# Patient Record
Sex: Female | Born: 1975 | Race: White | Hispanic: No | Marital: Married | Smoking: Former smoker
Health system: Southern US, Community
[De-identification: ages and names within clinical notes are randomized; demographics above are authoritative.]

## PROBLEM LIST (undated history)

## (undated) DIAGNOSIS — F32A Depression, unspecified: Secondary | ICD-10-CM

## (undated) DIAGNOSIS — E785 Hyperlipidemia, unspecified: Secondary | ICD-10-CM

## (undated) DIAGNOSIS — F419 Anxiety disorder, unspecified: Secondary | ICD-10-CM

## (undated) HISTORY — PX: WISDOM TOOTH EXTRACTION: SHX21

## (undated) HISTORY — PX: DILATION AND CURETTAGE OF UTERUS: SHX78

## (undated) HISTORY — DX: Anxiety disorder, unspecified: F41.9

## (undated) HISTORY — PX: HAMMER TOE SURGERY: SHX385

## (undated) HISTORY — DX: Hyperlipidemia, unspecified: E78.5

---

## 1998-07-20 ENCOUNTER — Other Ambulatory Visit: Admission: RE | Admit: 1998-07-20 | Discharge: 1998-07-20 | Payer: Self-pay | Admitting: Gynecology

## 2003-12-23 ENCOUNTER — Observation Stay: Payer: Self-pay | Admitting: Obstetrics and Gynecology

## 2009-10-15 ENCOUNTER — Ambulatory Visit: Payer: Self-pay | Admitting: Family Medicine

## 2012-04-24 ENCOUNTER — Ambulatory Visit: Payer: Self-pay | Admitting: Family Medicine

## 2016-02-24 ENCOUNTER — Encounter: Payer: Self-pay | Admitting: Family Medicine

## 2016-02-24 ENCOUNTER — Ambulatory Visit (INDEPENDENT_AMBULATORY_CARE_PROVIDER_SITE_OTHER): Payer: Self-pay | Admitting: Family Medicine

## 2016-02-24 ENCOUNTER — Other Ambulatory Visit: Payer: Self-pay

## 2016-02-24 VITALS — BP 98/64 | HR 78 | Temp 98.3°F | Resp 16 | Wt 139.6 lb

## 2016-02-24 DIAGNOSIS — Z0000 Encounter for general adult medical examination without abnormal findings: Secondary | ICD-10-CM

## 2016-02-24 DIAGNOSIS — J40 Bronchitis, not specified as acute or chronic: Secondary | ICD-10-CM

## 2016-02-24 DIAGNOSIS — B349 Viral infection, unspecified: Secondary | ICD-10-CM

## 2016-02-24 LAB — POCT INFLUENZA A/B
Influenza A, POC: NEGATIVE
Influenza B, POC: NEGATIVE

## 2016-02-24 MED ORDER — DOXYCYCLINE HYCLATE 100 MG PO TABS: 100.0000 mg | ORAL_TABLET | Freq: Two times a day (BID) | ORAL | 0 refills | Status: DC

## 2016-02-24 NOTE — Patient Instructions (Signed)
Acute Bronchitis, Adult Acute bronchitis is when air tubes (bronchi) in the lungs suddenly get swollen. The condition can make it hard to breathe. It can also cause these symptoms:  A cough.  Coughing up clear, yellow, or green mucus.  Wheezing.  Chest congestion.  Shortness of breath.  A fever.  Body aches.  Chills.  A sore throat. Follow these instructions at home: Medicines  Take over-the-counter and prescription medicines only as told by your doctor.  If you were prescribed an antibiotic medicine, take it as told by your doctor. Do not stop taking the antibiotic even if you start to feel better. General instructions  Rest.  Drink enough fluids to keep your pee (urine) clear or pale yellow.  Avoid smoking and secondhand smoke. If you smoke and you need help quitting, ask your doctor. Quitting will help your lungs heal faster.  Use an inhaler, cool mist vaporizer, or humidifier as told by your doctor.  Keep all follow-up visits as told by your doctor. This is important. How is this prevented? To lower your risk of getting this condition again:  Wash your hands often with soap and water. If you cannot use soap and water, use hand sanitizer.  Avoid contact with people who have cold symptoms.  Try not to touch your hands to your mouth, nose, or eyes.  Make sure to get the flu shot every year. Contact a doctor if:  Your symptoms do not get better in 2 weeks. Get help right away if:  You cough up blood.  You have chest pain.  You have very bad shortness of breath.  You become dehydrated.  You faint (pass out) or keep feeling like you are going to pass out.  You keep throwing up (vomiting).  You have a very bad headache.  Your fever or chills gets worse. This information is not intended to replace advice given to you by your health care provider. Make sure you discuss any questions you have with your health care provider. Document Released: 06/13/2007  Document Revised: 08/03/2015 Document Reviewed: 06/15/2015 Elsevier Interactive Patient Education  2017 [employer] Influenza, Adult Influenza, more commonly known as "the flu," is a viral infection that primarily affects the respiratory tract. The respiratory tract includes organs that help you breathe, such as the lungs, nose, and throat. The flu causes many common cold symptoms, as well as a high fever and body aches. The flu spreads easily from person to person (is contagious). Getting a flu shot (influenza vaccination) every year is the best way to prevent influenza. What are the causes? Influenza is caused by a virus. You can catch the virus by:  Breathing in droplets from an infected person's cough or sneeze.  Touching something that was recently contaminated with the virus and then touching your mouth, nose, or eyes. What increases the risk? The following factors may make you more likely to get the flu:  Not cleaning your hands frequently with soap and water or alcohol-based hand sanitizer.  Having close contact with many people during cold and flu season.  Touching your mouth, eyes, or nose without washing or sanitizing your hands first.  Not drinking enough fluids or not eating a healthy diet.  Not getting enough sleep or exercise.  Being under a high amount of stress.  Not getting a yearly (annual) flu shot. You may be at a higher risk of complications from the flu, such as a severe lung infection (pneumonia), if you:  Are over the age  of 65.  Are pregnant.  Have a weakened disease-fighting system (immune system). You may have a weakened immune system if you:  Have HIV or AIDS.  Are undergoing chemotherapy.  Aretaking medicines that reduce the activity of (suppress) the immune system.  Have a long-term (chronic) illness, such as heart disease, kidney disease, diabetes, or lung disease.  Have a liver disorder.  Are obese.  Have anemia. What are the signs  or symptoms? Symptoms of this condition typically last 4-10 days and may include:  Fever.  Chills.  Headache, body aches, or muscle aches.  Sore throat.  Cough.  Runny or congested nose.  Chest discomfort and cough.  Poor appetite.  Weakness or tiredness (fatigue).  Dizziness.  Nausea or vomiting. How is this diagnosed? This condition may be diagnosed based on your medical history and a physical exam. Your health care provider may do a nose or throat swab test to confirm the diagnosis. How is this treated? If influenza is detected early, you can be treated with antiviral medicine that can reduce the length of your illness and the severity of your symptoms. This medicine may be given by mouth (orally) or through an IV tube that is inserted in one of your veins. The goal of treatment is to relieve symptoms by taking care of yourself at home. This may include taking over-the-counter medicines, drinking plenty of fluids, and adding humidity to the air in your home. In some cases, influenza goes away on its own. Severe influenza or complications from influenza may be treated in a hospital. Follow these instructions at home:  Take over-the-counter and prescription medicines only as told by your health care provider.  Use a cool mist humidifier to add humidity to the air in your home. This can make breathing easier.  Rest as needed.  Drink enough fluid to keep your urine clear or pale yellow.  Cover your mouth and nose when you cough or sneeze.  Wash your hands with soap and water often, especially after you cough or sneeze. If soap and water are not available, use hand sanitizer.  Stay home from work or school as told by your health care provider. Unless you are visiting your health care provider, try to avoid leaving home until your fever has been gone for 24 hours without the use of medicine.  Keep all follow-up visits as told by your health care provider. This is  important. How is this prevented?  Getting an annual flu shot is the best way to avoid getting the flu. You may get the flu shot in late summer, fall, or winter. Ask your health care provider when you should get your flu shot.  Wash your hands often or use hand sanitizer often.  Avoid contact with people who are sick during cold and flu season.  Eat a healthy diet, drink plenty of fluids, get enough sleep, and exercise regularly. Contact a health care provider if:  You develop new symptoms.  You have:  Chest pain.  Diarrhea.  A fever.  Your cough gets worse.  You produce more mucus.  You feel nauseous or you vomit. Get help right away if:  You develop shortness of breath or difficulty breathing.  Your skin or nails turn a bluish color.  You have severe pain or stiffness in your neck.  You develop a sudden headache or sudden pain in your face or ear.  You cannot stop vomiting. This information is not intended to replace advice given to you by  your health care provider. Make sure you discuss any questions you have with your health care provider. Document Released: 12/23/1999 Document Revised: 06/02/2015 Document Reviewed: 10/19/2014 Elsevier Interactive Patient Education  2017 [employer].

## 2016-02-24 NOTE — Progress Notes (Signed)
Patient: Debbie Rowe Female    DOB: 1975-12-14   41 y.o.   MRN: [id number] Visit Date: 02/24/2016  Today's Provider: Dortha Kern, PA   Chief Complaint  Patient presents with  . URI   Subjective:    URI   This is a new problem. Episode onset: Sunday. The problem has been unchanged. Associated symptoms include coughing, diarrhea, headaches, neck pain, rhinorrhea and wheezing. Associated symptoms comments: Mother was diagnosed with Influenza B two days ago. Daughter beginning to have headache, scratchy throat, cough and body aches today, also.. Treatments tried: Tamiflu. The treatment provided no relief.   Past Medical History:  Diagnosis Date  . Anxiety    No past surgical history on file.  Family History  Problem Relation Age of Onset  . Depression Mother   . Cancer Maternal Grandmother   . Heart disease Maternal Grandfather   . Cancer Paternal Grandmother   . Heart disease Paternal Grandfather    No Known Allergies   Previous Medications   ALPRAZOLAM (XANAX) 0.5 MG TABLET    Take by mouth.   CITALOPRAM (CELEXA) 20 MG TABLET    Take by mouth.   COPPER PO    by Intrauterine route.    Review of Systems  Constitutional: Positive for chills and fatigue.  HENT: Positive for postnasal drip and rhinorrhea.   Respiratory: Positive for cough, chest tightness, shortness of breath and wheezing.   Cardiovascular: Negative.   Gastrointestinal: Positive for diarrhea.  Musculoskeletal: Positive for myalgias, neck pain and neck stiffness.  Neurological: Positive for light-headedness and headaches.    Social History  Substance Use Topics  . Smoking status: Former Games developer  . Smokeless tobacco: Never Used  . Alcohol use No   Objective:   BP 98/64 (BP Location: Right Arm, Patient Position: Sitting, Cuff Size: Normal)   Pulse 78   Temp 98.3 F (36.8 C) (Oral)   Resp 16   Wt 139 lb 9.6 oz (63.3 kg)   SpO2 98%   Physical Exam  Constitutional: She is oriented to person,  place, and time. She appears well-developed and well-nourished. No distress.  HENT:  Head: Normocephalic and atraumatic.  Right Ear: Hearing and external ear normal.  Left Ear: Hearing and external ear normal.  Nose: Nose normal.  Slight irritation to tonsillar pillars. No exudates.  Eyes: Conjunctivae and lids are normal. Right eye exhibits no discharge. Left eye exhibits no discharge. No scleral icterus.  Neck: Neck supple.  Cardiovascular: Normal rate and regular rhythm.   Pulmonary/Chest: Effort normal. No respiratory distress. She has no wheezes. She has no rales.  Coarse breath sounds.  Abdominal: Soft. Bowel sounds are normal.  Musculoskeletal: Normal range of motion.  Neurological: She is alert and oriented to person, place, and time.  Skin: Skin is intact. No lesion and no rash noted.  Psychiatric: She has a normal mood and affect. Her speech is normal and behavior is normal. Thought content normal.      Assessment & Plan:     1. Bronchitis Onset with chills, low grade fever, headache and hacking cough with occasional sputum production (some blood in phlegm this morning). No wheeze or rales but some coarse breath sounds. Continue Mucinex-DM and Tylenol prn. Increase fluids and start antibiotic. Recheck prn. - CBC with Differential/Platelet - doxycycline (VIBRA-TABS) 100 MG tablet; Take 1 tablet (100 mg total) by mouth 2 (two) times daily.  Dispense: 20 tablet; Refill: 0 - POCT Influenza A/B  2. Viral illness Mother diagnosed  with influenza-B 2 days ago. She was given prophylactic Tamiflu but flu test negative today. Will check CBC and continue Tamiflu. - CBC with Differential/Platelet

## 2016-02-25 LAB — CBC WITH DIFFERENTIAL/PLATELET
BASOS: 1 %
Basophils Absolute: 0 10*3/uL (ref 0.0–0.2)
EOS (ABSOLUTE): 0 10*3/uL (ref 0.0–0.4)
EOS: 1 %
HEMATOCRIT: 40.3 % (ref 34.0–46.6)
Hemoglobin: 13.6 g/dL (ref 11.1–15.9)
IMMATURE GRANULOCYTES: 0 %
Immature Grans (Abs): 0 10*3/uL (ref 0.0–0.1)
LYMPHS ABS: 1.5 10*3/uL (ref 0.7–3.1)
Lymphs: 33 %
MCH: 30.2 pg (ref 26.6–33.0)
MCHC: 33.7 g/dL (ref 31.5–35.7)
MCV: 90 fL (ref 79–97)
MONOS ABS: 0.5 10*3/uL (ref 0.1–0.9)
Monocytes: 12 %
NEUTROS ABS: 2.4 10*3/uL (ref 1.4–7.0)
Neutrophils: 53 %
PLATELETS: 244 10*3/uL (ref 150–379)
RBC: 4.5 x10E6/uL (ref 3.77–5.28)
RDW: 12.9 % (ref 12.3–15.4)
WBC: 4.5 10*3/uL (ref 3.4–10.8)

## 2016-04-20 ENCOUNTER — Encounter: Payer: Self-pay | Admitting: Emergency Medicine

## 2016-04-20 DIAGNOSIS — Z79899 Other long term (current) drug therapy: Secondary | ICD-10-CM | POA: Diagnosis not present

## 2016-04-20 DIAGNOSIS — M549 Dorsalgia, unspecified: Secondary | ICD-10-CM | POA: Insufficient documentation

## 2016-04-20 DIAGNOSIS — R1030 Lower abdominal pain, unspecified: Secondary | ICD-10-CM | POA: Diagnosis not present

## 2016-04-20 DIAGNOSIS — R1010 Upper abdominal pain, unspecified: Secondary | ICD-10-CM | POA: Insufficient documentation

## 2016-04-20 DIAGNOSIS — Z87891 Personal history of nicotine dependence: Secondary | ICD-10-CM | POA: Insufficient documentation

## 2016-04-20 DIAGNOSIS — G8929 Other chronic pain: Secondary | ICD-10-CM | POA: Diagnosis not present

## 2016-04-20 LAB — COMPREHENSIVE METABOLIC PANEL
ALK PHOS: 64 U/L (ref 38–126)
ALT: 15 U/L (ref 14–54)
AST: 17 U/L (ref 15–41)
Albumin: 4 g/dL (ref 3.5–5.0)
Anion gap: 7 (ref 5–15)
BUN: 19 mg/dL (ref 6–20)
CALCIUM: 9 mg/dL (ref 8.9–10.3)
CO2: 25 mmol/L (ref 22–32)
CREATININE: 0.55 mg/dL (ref 0.44–1.00)
Chloride: 104 mmol/L (ref 101–111)
Glucose, Bld: 95 mg/dL (ref 65–99)
Potassium: 3.7 mmol/L (ref 3.5–5.1)
Sodium: 136 mmol/L (ref 135–145)
Total Bilirubin: 0.3 mg/dL (ref 0.3–1.2)
Total Protein: 6.9 g/dL (ref 6.5–8.1)

## 2016-04-20 LAB — CBC
HCT: 36.2 % (ref 35.0–47.0)
Hemoglobin: 12.8 g/dL (ref 12.0–16.0)
MCH: 31.7 pg (ref 26.0–34.0)
MCHC: 35.2 g/dL (ref 32.0–36.0)
MCV: 89.9 fL (ref 80.0–100.0)
PLATELETS: 285 10*3/uL (ref 150–440)
RBC: 4.03 MIL/uL (ref 3.80–5.20)
RDW: 12.7 % (ref 11.5–14.5)
WBC: 10.5 10*3/uL (ref 3.6–11.0)

## 2016-04-20 LAB — LIPASE, BLOOD: Lipase: 33 U/L (ref 11–51)

## 2016-04-20 NOTE — ED Triage Notes (Signed)
Pt ambulatory to triage in NAD, reports lower back pain x several weeks, already seeing chiropractor.  Pt reports abd swelling and pressure over past few days, states discomfort awakens her from sleep.

## 2016-04-21 ENCOUNTER — Emergency Department: Payer: Managed Care, Other (non HMO)

## 2016-04-21 ENCOUNTER — Emergency Department
Admission: EM | Admit: 2016-04-21 | Discharge: 2016-04-21 | Disposition: A | Discharge status: Home / Self Care | Payer: Managed Care, Other (non HMO) | Attending: Emergency Medicine | Admitting: Emergency Medicine

## 2016-04-21 DIAGNOSIS — M549 Dorsalgia, unspecified: Secondary | ICD-10-CM

## 2016-04-21 DIAGNOSIS — G8929 Other chronic pain: Secondary | ICD-10-CM

## 2016-04-21 DIAGNOSIS — R1030 Lower abdominal pain, unspecified: Secondary | ICD-10-CM

## 2016-04-21 LAB — URINALYSIS, COMPLETE (UACMP) WITH MICROSCOPIC
BACTERIA UA: NONE SEEN
BILIRUBIN URINE: NEGATIVE
Glucose, UA: NEGATIVE mg/dL
Hgb urine dipstick: NEGATIVE
KETONES UR: NEGATIVE mg/dL
Leukocytes, UA: NEGATIVE
Nitrite: NEGATIVE
PROTEIN: NEGATIVE mg/dL
SPECIFIC GRAVITY, URINE: 1.027 (ref 1.005–1.030)
pH: 5 (ref 5.0–8.0)

## 2016-04-21 LAB — PREGNANCY, URINE: Preg Test, Ur: NEGATIVE

## 2016-04-21 LAB — WET PREP, GENITAL
CLUE CELLS WET PREP: NONE SEEN
Sperm: NONE SEEN
TRICH WET PREP: NONE SEEN
Yeast Wet Prep HPF POC: NONE SEEN

## 2016-04-21 LAB — CHLAMYDIA/NGC RT PCR (ARMC ONLY)
CHLAMYDIA TR: NOT DETECTED
N gonorrhoeae: NOT DETECTED

## 2016-04-21 MED ORDER — IOPAMIDOL (ISOVUE-300) INJECTION 61%
100.0000 mL | Freq: Once | INTRAVENOUS | Status: AC | PRN
  Administered 2016-04-21: 100 mL via INTRAVENOUS

## 2016-04-21 MED ORDER — IOPAMIDOL (ISOVUE-300) INJECTION 61%
30.0000 mL | Freq: Once | INTRAVENOUS | Status: AC
  Administered 2016-04-21: 30 mL via ORAL

## 2016-04-21 MED ORDER — ONDANSETRON HCL 4 MG/2ML IJ SOLN
4.0000 mg | Freq: Once | INTRAMUSCULAR | Status: AC
  Administered 2016-04-21: 4 mg via INTRAVENOUS
  Filled 2016-04-21: qty 2

## 2016-04-21 MED ORDER — TRAMADOL HCL 50 MG PO TABS: 50.0000 mg | ORAL_TABLET | Freq: Four times a day (QID) | ORAL | 0 refills | Status: DC | PRN

## 2016-04-21 MED ORDER — KETOROLAC TROMETHAMINE 30 MG/ML IJ SOLN
30.0000 mg | Freq: Once | INTRAMUSCULAR | Status: AC
  Administered 2016-04-21: 30 mg via INTRAVENOUS
  Filled 2016-04-21: qty 1

## 2016-04-21 MED ORDER — ONDANSETRON 4 MG PO TBDP: 4.0000 mg | ORAL_TABLET | Freq: Three times a day (TID) | ORAL | 0 refills | Status: DC | PRN

## 2016-04-21 NOTE — ED Provider Notes (Signed)
[facility] Emergency Department Provider Note   ____________________________________________   First MD Initiated Contact with Patient 04/21/16 [phone]     (approximate)  I have reviewed the triage vital signs and the nursing notes.   HISTORY  Chief Complaint Back Pain and Abdominal Pain    HPI Debbie Rowe is a 41 y.o. female who comes into the hospital today with back pain and abdominal pain. The patient reports a 6-7 weeks ago he started having some severe back pain. She had a similar episode 3 years ago where she saw the chiropractor and the pain improved and she stopped seeing them. The patient started back seen the chiropractor and has been doing therapy but reports that she developed some swelling in her abdomen 3 days ago. She also reports that she has some abdominal discomfort so bad that she is unable to sleep. The patient reports that she has been taking ibuprofen Midol and heating pads but hasn't helped. The patient reports that her menstrual cycle is 2 weeks late. She's had decreased energy. She also has an IUD. She came in tonight because she wanted to rule out anything. She reports that initially she would like an MRI to look at her back and then wants to make sure everything is okay and her belly. The patient denies any diarrhea and says that she has normal bowel movements. The patient is here today for evaluation.   Past Medical History:  Diagnosis Date  . Anxiety   . Hyperlipidemia     There are no active problems to display for this patient.   Past Surgical History:  Procedure Laterality Date  . CESAREAN SECTION      Prior to Admission medications   Medication Sig Start Date End Date Taking? Authorizing Provider  ALPRAZolam Prudy Feeler) 0.5 MG tablet Take by mouth.    Historical Provider, MD  citalopram (CELEXA) 20 MG tablet Take by mouth.    Historical Provider, MD  COPPER PO by Intrauterine route.    Historical Provider, MD    doxycycline (VIBRA-TABS) 100 MG tablet Take 1 tablet (100 mg total) by mouth 2 (two) times daily. 02/24/16   Jodell Cipro Chrismon, PA  ondansetron (ZOFRAN ODT) 4 MG disintegrating tablet Take 1 tablet (4 mg total) by mouth every 8 (eight) hours as needed for nausea or vomiting. 04/21/16   Rebecka Apley, MD  traMADol (ULTRAM) 50 MG tablet Take 1 tablet (50 mg total) by mouth every 6 (six) hours as needed. 04/21/16   Rebecka Apley, MD    Allergies Patient has no known allergies.  Family History  Problem Relation Age of Onset  . Depression Mother   . Cancer Maternal Grandmother   . Heart disease Maternal Grandfather   . Cancer Paternal Grandmother   . Heart disease Paternal Grandfather     Social History Social History  Substance Use Topics  . Smoking status: Former Games developer  . Smokeless tobacco: Never Used  . Alcohol use No    Review of Systems Constitutional: No fever/chills Eyes: No visual changes. ENT: No sore throat. Cardiovascular: Denies chest pain. Respiratory: Denies shortness of breath. Gastrointestinal:  abdominal pain.  No nausea, no vomiting.  No diarrhea.  No constipation. Genitourinary: Negative for dysuria. Musculoskeletal:back pain. Skin: Negative for rash. Neurological: Negative for headaches, focal weakness or numbness.  10-point ROS otherwise negative.  ____________________________________________   PHYSICAL EXAM:  VITAL SIGNS: ED Triage Vitals  Enc Vitals Group     BP 04/20/16 2202  131/71     Pulse Rate 04/20/16 2202 76     Resp 04/20/16 2202 16     Temp 04/20/16 2202 98.3 F (36.8 C)     Temp Source 04/20/16 2202 Oral     SpO2 04/20/16 2202 99 %     Weight 04/20/16 2203 136 lb (61.7 kg)     Height 04/20/16 2203 5' (1.524 m)     Head Circumference --      Peak Flow --      Pain Score 04/20/16 2202 7     Pain Loc --      Pain Edu? --      Excl. in GC? --     Constitutional: Alert and oriented. Well appearing and in mild  distress. Eyes: Conjunctivae are normal. PERRL. EOMI. Head: Atraumatic. Nose: No congestion/rhinnorhea. Mouth/Throat: Mucous membranes are moist.  Oropharynx non-erythematous. Cardiovascular: Normal rate, regular rhythm. Grossly normal heart sounds.  Good peripheral circulation. Respiratory: Normal respiratory effort.  No retractions. Lungs CTAB. Gastrointestinal: Soft and nontender. No distention. Positive bowel sounds Genitourinary: normal external genitalia, the patient's IUD strings are visualized. She has no cervical motion tenderness but her uterus does feel mildly enlarged. The patient has no significant pain at this time. Musculoskeletal: No lower extremity tenderness nor edema.   Neurologic:  Normal speech and language.  Skin:  Skin is warm, dry and intact. Psychiatric: Mood and affect are normal.   ____________________________________________   LABS (all labs ordered are listed, but only abnormal results are displayed)  Labs Reviewed  WET PREP, GENITAL - Abnormal; Notable for the following:       Result Value   WBC, Wet Prep HPF POC MANY (*)    All other components within normal limits  URINALYSIS, COMPLETE (UACMP) WITH MICROSCOPIC - Abnormal; Notable for the following:    Color, Urine YELLOW (*)    APPearance HAZY (*)    Squamous Epithelial / LPF 0-5 (*)    All other components within normal limits  CHLAMYDIA/NGC RT PCR ([facility] ONLY)  LIPASE, BLOOD  COMPREHENSIVE METABOLIC PANEL  CBC  PREGNANCY, URINE   ____________________________________________  EKG  none ____________________________________________  RADIOLOGY  CT abd and pelvis ____________________________________________   PROCEDURES  Procedure(s) performed: None  Procedures  Critical Care performed: No  ____________________________________________   INITIAL IMPRESSION / ASSESSMENT AND PLAN / ED COURSE  Pertinent labs & imaging results that were available during my care of the  patient were reviewed by me and considered in my medical decision making (see chart for details).  This is a 41 year old who comes into the hospital today with some abdominal discomfort as well as some back pain. The patient came in to get checked out. I informed her that without having neurologic deficit and with this being 6 weeks of pain I could not do an MRI but I did perform a CT scan given the patient's uterine fullness. I fear is that the patient is concerned about having cancer. CT came back showing a small ovarian cyst on the right. Otherwise the patient has no further complaints or concerns. She will be discharged to home. I informed her that she should follow-up with her primary care physician. The patient be discharged home. The patient did receive some Toradol for her discomfort.   CT abd and pelvis: No acute abnormality of the abdomen or pelvis.      ____________________________________________   FINAL CLINICAL IMPRESSION(S) / ED DIAGNOSES  Final diagnoses:  Lower abdominal pain  Chronic midline back  pain, unspecified back location      NEW MEDICATIONS STARTED DURING THIS VISIT:  Discharge Medication List as of 04/21/2016  4:48 AM    START taking these medications   Details  ondansetron (ZOFRAN ODT) 4 MG disintegrating tablet Take 1 tablet (4 mg total) by mouth every 8 (eight) hours as needed for nausea or vomiting., Starting Sat 04/21/2016, Print    traMADol (ULTRAM) 50 MG tablet Take 1 tablet (50 mg total) by mouth every 6 (six) hours as needed., Starting Sat 04/21/2016, Print         Note:  This document was prepared using Dragon voice recognition software and may include unintentional dictation errors.    Rebecka Apley, MD 04/21/16 [phone]

## 2016-04-21 NOTE — ED Notes (Signed)
MD Webster at bedside at this time.  

## 2016-04-21 NOTE — ED Notes (Signed)
This RN to call bell, pt reports nausea and burning sensation, requesting meds and or "Tums or Rolaids", primary RN and Dr Zenda Alpers notified, orders rx'd

## 2016-04-21 NOTE — ED Notes (Signed)
Pt states current tx for back pain.   Pt also states acute swelling and discomfort throughout abdomen, specifically lower. Pt states current 90-yr IUD (41 year old), 2 weeks late on period.

## 2016-04-21 NOTE — ED Notes (Signed)
Pelvic cart and EDP at bedside att, CT called for IV and pt finished contrast

## 2016-12-13 ENCOUNTER — Other Ambulatory Visit: Payer: Self-pay | Admitting: Obstetrics and Gynecology

## 2016-12-13 DIAGNOSIS — Z1231 Encounter for screening mammogram for malignant neoplasm of breast: Secondary | ICD-10-CM

## 2018-01-29 ENCOUNTER — Ambulatory Visit (INDEPENDENT_AMBULATORY_CARE_PROVIDER_SITE_OTHER): Payer: 59

## 2018-01-29 ENCOUNTER — Encounter: Payer: Self-pay | Admitting: Orthopaedic Surgery

## 2018-01-29 ENCOUNTER — Ambulatory Visit (INDEPENDENT_AMBULATORY_CARE_PROVIDER_SITE_OTHER): Payer: 59 | Admitting: Orthopaedic Surgery

## 2018-01-29 VITALS — BP 123/72 | HR 69 | Ht 59.0 in

## 2018-01-29 DIAGNOSIS — M25562 Pain in left knee: Secondary | ICD-10-CM

## 2018-01-29 DIAGNOSIS — G8929 Other chronic pain: Secondary | ICD-10-CM

## 2018-01-29 DIAGNOSIS — M25561 Pain in right knee: Secondary | ICD-10-CM

## 2018-01-29 NOTE — Progress Notes (Signed)
Subjective:    Patient ID: Debbie Rowe, female    DOB: Sep 18, 1975, 43 y.o.   MRN: [id number]  HPI She has bilateral knee pain, more on the right than the left. She has had pain over the last year with the right knee.  She had swelling and popping.  She was seen at [facility] and had aspiration and prednisone injection in late June.  She did well until December. 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  She had swelling and pain again and limited motion.  She went back to [place] [facility] and had another aspiration and prednisone injection.  She is referred here for further evaluation.  She has no giving way, no redness, no direct trauma.  She stopped wearing high heels a year ago.  She takes an occasional Advil as needed.     Review of Systems  Constitutional: Positive for activity change.  Musculoskeletal: Positive for arthralgias, gait problem and joint swelling.  Psychiatric/Behavioral: The patient is nervous/anxious.   All other systems reviewed and are negative.  For Review of Systems, all other systems reviewed and are negative.  The following is a summary of the past history medically, past history surgically, known current medicines, social history and family history.  This information is gathered electronically by the computer from prior information and documentation.  I review this each visit and have found including this information at this point in the chart is beneficial and informative.   Past Medical History:  Diagnosis Date  . Anxiety   . Hyperlipidemia     Past Surgical History:  Procedure Laterality Date  . CESAREAN SECTION      Current Outpatient Medications on File Prior to Visit  Medication Sig Dispense Refill  . ALPRAZolam (XANAX) 0.5 MG tablet Take by mouth.    . citalopram (CELEXA) 20 MG tablet Take by mouth.    . COPPER PO by Intrauterine route.    . doxycycline (VIBRA-TABS) 100 MG tablet Take 1 tablet (100 mg total)  by mouth 2 (two) times daily. 20 tablet 0  . ondansetron (ZOFRAN ODT) 4 MG disintegrating tablet Take 1 tablet (4 mg total) by mouth every 8 (eight) hours as needed for nausea or vomiting. 20 tablet 0  . traMADol (ULTRAM) 50 MG tablet Take 1 tablet (50 mg total) by mouth every 6 (six) hours as needed. 12 tablet 0   No current facility-administered medications on file prior to visit.     Social History   Socioeconomic History  . Marital status: Married    Spouse name: Not on file  . Number of children: Not on file  . Years of education: Not on file  . Highest education level: Not on file  Occupational History  . Not on file  Social Needs  . Financial resource strain: Not on file  . Food insecurity:    Worry: Not on file    Inability: Not on file  . Transportation needs:    Medical: Not on file    Non-medical: Not on file  Tobacco Use  . Smoking status: Former Games developer  . Smokeless tobacco: Never Used  Substance and Sexual Activity  . Alcohol use: No  . Drug use: No  . Sexual activity: Not on file  Lifestyle  . Physical activity:    Days per week: Not on file    Minutes per session: Not on file  . Stress: Not on file  Relationships  . Social connections:    Talks on phone: Not on file    Gets together: Not on file    Attends religious service: Not on file    Active member of club or organization: Not on file    Attends meetings of clubs or organizations: Not on file    Relationship status: Not on file  . Intimate partner violence:    Fear of current or ex partner: Not on file    Emotionally abused: Not on file    Physically abused: Not on file    Forced sexual activity: Not on file  Other Topics Concern  . Not on file  Social History Narrative  . Not on file    Family History  Problem Relation Age of Onset  . Depression Mother   . Cancer Maternal Grandmother   . Heart disease Maternal Grandfather   . Cancer Paternal Grandmother   . Heart disease Paternal  Grandfather     BP 123/72   Pulse 69   Ht 4\' 11"  (1.499 m)   BMI 27.47 kg/m   Body mass index is 27.47 kg/m.      Objective:   Physical Exam Constitutional:      Appearance: She is well-developed.  HENT:     Head: Normocephalic and atraumatic.  Eyes:     Conjunctiva/sclera: Conjunctivae normal.     Pupils: Pupils are equal, round, and reactive to light.  Neck:     Musculoskeletal: Normal range of motion and neck supple.  Cardiovascular:     Rate and Rhythm: Normal rate and regular rhythm.  Pulmonary:     Effort: Pulmonary effort is normal.  Abdominal:     Palpations: Abdomen is soft.  Musculoskeletal:     Right knee: She exhibits decreased range of motion. Tenderness found. Medial joint line tenderness noted.     Left knee: Tenderness found. Medial joint line tenderness noted.       Legs:  Skin:    General: Skin is warm and dry.  Neurological:     Mental Status: She is alert and oriented to person, place, and time.     Cranial Nerves: No cranial nerve deficit.     Motor: No abnormal muscle tone.     Coordination: Coordination normal.     Deep Tendon Reflexes: Reflexes are normal and symmetric. Reflexes normal.  Psychiatric:        Behavior: Behavior normal.        Thought Content: Thought content normal.        Judgment: Judgment normal.      X-rays were done of both knees, reported separately.     Assessment & Plan:   Encounter Diagnosis  Name Primary?  . Bilateral chronic knee pain Yes   I am concerned about a medial meniscus tear of the right knee.  I would like to get a MRI.  I have recommended Advil or Aleve as needed.  Return after the MRI.  Call if any problem.  Precautions discussed.   Electronically Signed Darreld Mclean, MD 1/22/20203:29 PM

## 2018-12-09 ENCOUNTER — Other Ambulatory Visit: Payer: Self-pay

## 2018-12-09 DIAGNOSIS — Z20822 Contact with and (suspected) exposure to covid-19: Secondary | ICD-10-CM

## 2018-12-11 ENCOUNTER — Telehealth: Payer: Self-pay | Admitting: *Deleted

## 2018-12-11 LAB — NOVEL CORONAVIRUS, NAA: SARS-CoV-2, NAA: NOT DETECTED

## 2018-12-11 NOTE — Telephone Encounter (Signed)
Patient calling for test results- notified negative for COVID

## 2019-06-18 ENCOUNTER — Ambulatory Visit: Payer: 59 | Attending: Internal Medicine

## 2019-06-18 ENCOUNTER — Other Ambulatory Visit: Payer: Self-pay

## 2019-06-18 DIAGNOSIS — Z20822 Contact with and (suspected) exposure to covid-19: Secondary | ICD-10-CM | POA: Insufficient documentation

## 2019-06-19 LAB — NOVEL CORONAVIRUS, NAA: SARS-CoV-2, NAA: NOT DETECTED

## 2019-06-19 LAB — SARS-COV-2, NAA 2 DAY TAT

## 2020-01-18 ENCOUNTER — Encounter: Payer: Self-pay | Admitting: Emergency Medicine

## 2020-01-18 ENCOUNTER — Ambulatory Visit
Admission: EM | Admit: 2020-01-18 | Discharge: 2020-01-18 | Disposition: A | Discharge status: Home / Self Care | Payer: Commercial Managed Care - PPO | Attending: Emergency Medicine | Admitting: Emergency Medicine

## 2020-01-18 ENCOUNTER — Other Ambulatory Visit: Payer: Self-pay

## 2020-01-18 DIAGNOSIS — R509 Fever, unspecified: Secondary | ICD-10-CM | POA: Insufficient documentation

## 2020-01-18 DIAGNOSIS — J020 Streptococcal pharyngitis: Secondary | ICD-10-CM | POA: Diagnosis not present

## 2020-01-18 DIAGNOSIS — R52 Pain, unspecified: Secondary | ICD-10-CM

## 2020-01-18 DIAGNOSIS — Z1152 Encounter for screening for COVID-19: Secondary | ICD-10-CM | POA: Insufficient documentation

## 2020-01-18 DIAGNOSIS — R519 Headache, unspecified: Secondary | ICD-10-CM | POA: Diagnosis present

## 2020-01-18 DIAGNOSIS — J029 Acute pharyngitis, unspecified: Secondary | ICD-10-CM | POA: Insufficient documentation

## 2020-01-18 LAB — POCT RAPID STREP A (OFFICE): Rapid Strep A Screen: NEGATIVE

## 2020-01-18 MED ORDER — LIDOCAINE VISCOUS HCL 2 % MT SOLN: 15.0000 mL | OROMUCOSAL | 1 refills | Status: DC | PRN

## 2020-01-18 NOTE — ED Provider Notes (Signed)
[facility]   [id number] 01/18/20 Arrival Time: 0943  CC: COVID symptoms   SUBJECTIVE: History from: patient.  MAURINA FAWAZ is a 45 y.o. female who who presented to the urgent care for complaint of chills, fever, sore throat, body aches, headache for the past 2 to 3 days.  Denies sick exposure or precipitating event.  Has tried OTC medication without relief.-Aggravating factors.  R Previous symptoms in the past.    Denies fever, chills, decreased appetite, decreased activity, drooling, vomiting, wheezing, rash, changes in bowel or bladder function.     ROS: As per HPI.  All other pertinent ROS negative.      Past Medical History:  Diagnosis Date  . Anxiety   . Hyperlipidemia    Past Surgical History:  Procedure Laterality Date  . CESAREAN SECTION     No Known Allergies No current facility-administered medications on file prior to encounter.   Current Outpatient Medications on File Prior to Encounter  Medication Sig Dispense Refill  . ALPRAZolam (XANAX) 0.5 MG tablet Take by mouth.    . citalopram (CELEXA) 20 MG tablet Take by mouth.    . COPPER PO by Intrauterine route.    . doxycycline (VIBRA-TABS) 100 MG tablet Take 1 tablet (100 mg total) by mouth 2 (two) times daily. 20 tablet 0  . ondansetron (ZOFRAN ODT) 4 MG disintegrating tablet Take 1 tablet (4 mg total) by mouth every 8 (eight) hours as needed for nausea or vomiting. 20 tablet 0  . traMADol (ULTRAM) 50 MG tablet Take 1 tablet (50 mg total) by mouth every 6 (six) hours as needed. 12 tablet 0   Social History   Socioeconomic History  . Marital status: Married    Spouse name: Not on file  . Number of children: Not on file  . Years of education: Not on file  . Highest education level: Not on file  Occupational History  . Not on file  Tobacco Use  . Smoking status: Former Games developer  . Smokeless tobacco: Never Used  Substance and Sexual Activity  . Alcohol use: No  . Drug use: No  . Sexual activity:  Not on file  Other Topics Concern  . Not on file  Social History Narrative  . Not on file   Social Determinants of Health   Financial Resource Strain: Not on file  Food Insecurity: Not on file  Transportation Needs: Not on file  Physical Activity: Not on file  Stress: Not on file  Social Connections: Not on file  Intimate Partner Violence: Not on file   Family History  Problem Relation Age of Onset  . Depression Mother   . Cancer Maternal Grandmother   . Heart disease Maternal Grandfather   . Cancer Paternal Grandmother   . Heart disease Paternal Grandfather     OBJECTIVE:  Vitals:   01/18/20 0959 01/18/20 1000  BP: 133/81   Pulse: 75   Resp: 18   Temp: (!) 97.5 F (36.4 C)   TempSrc: Oral   SpO2: 95%   Weight:  140 lb (63.5 kg)  Height:  4\' 11"  (1.499 m)     General appearance: alert; smiling and laughing during encounter; nontoxic appearance HEENT: NCAT; Ears: EACs clear, TMs pearly gray; Eyes: PERRL.  EOM grossly intact. Nose: no rhinorrhea without nasal flaring; Throat: oropharynx clear, tolerating own secretions, tonsils not erythematous or enlarged, uvula midline Neck: supple without LAD; FROM Lungs: CTA bilaterally without adventitious breath sounds; normal respiratory effort, no belly breathing  or accessory muscle use; no cough present Heart: regular rate and rhythm.  Radial pulses 2+ symmetrical bilaterally Abdomen: soft; normal active bowel sounds; nontender to palpation Skin: warm and dry; no obvious rashes Psychological: alert and cooperative; normal mood and affect appropriate for age   ASSESSMENT & PLAN:  1. Sore throat   2. Encounter for screening for COVID-19   3. Fever and chills   4. Body aches   5. Acute nonintractable headache, unspecified headache type     Meds ordered this encounter  Medications  . lidocaine (XYLOCAINE) 2 % solution    Sig: Use as directed 15 mLs in the mouth or throat as needed for mouth pain.    Dispense:  100 mL     Refill:  1     Discharge instructions  Strep test is negative.  Sample will be sent for culture and someone will call if your result is abnormal  COVID-19, flu A/B testing ordered.  It may take between 2- 7 days for test results.  Someone will call if your result is abnormal  Lidocaine mouthwash was prescribed for sore throat./Take as directed Gargle with salty warm water May use OTC lozenges such as Halls, Vicks or Cepacol to soothe throat Encourage fluid intake.  Continue to alternate Tylenol/ibuprofen as needed for pain and fever Follow up with PCP Call or go to the ED if child has any new or worsening symptoms like fever, decreased appetite, decreased activity, turning blue, nasal flaring, rib retractions, wheezing, rash, changes in bowel or bladder habits, etc...   Reviewed expectations re: course of current medical issues. Questions answered. Outlined signs and symptoms indicating need for more acute intervention. Patient verbalized understanding. After Visit Summary given.          Durward Parcel, FNP 01/18/20 1112

## 2020-01-18 NOTE — Discharge Instructions (Signed)
Strep test is negative.  Sample will be sent for culture and someone will call if your result is abnormal  COVID-19, flu A/B testing ordered.  It may take between 2- 7 days for test results.  Someone will call if your result is abnormal  Lidocaine mouthwash was prescribed for sore throat./Take as directed Gargle with salty warm water May use OTC lozenges such as Halls, Vicks or Cepacol to soothe throat Encourage fluid intake.  Continue to alternate Tylenol/ibuprofen as needed for pain and fever Follow up with PCP Call or go to the ED if child has any new or worsening symptoms like fever, decreased appetite, decreased activity, turning blue, nasal flaring, rib retractions, wheezing, rash, changes in bowel or bladder habits, etc..Marland Kitchen

## 2020-01-18 NOTE — ED Triage Notes (Signed)
Body aches, chills, sore throat, headache since saturday

## 2020-01-19 LAB — CULTURE, GROUP A STREP (THRC)

## 2020-01-20 LAB — CULTURE, GROUP A STREP (THRC)

## 2020-01-21 LAB — COVID-19, FLU A+B NAA
Influenza A, NAA: NOT DETECTED
Influenza B, NAA: NOT DETECTED
SARS-CoV-2, NAA: NOT DETECTED

## 2020-10-11 ENCOUNTER — Other Ambulatory Visit (HOSPITAL_COMMUNITY): Payer: Self-pay | Admitting: Physician Assistant

## 2020-10-11 ENCOUNTER — Other Ambulatory Visit: Payer: Self-pay | Admitting: Physician Assistant

## 2020-10-11 DIAGNOSIS — M2392 Unspecified internal derangement of left knee: Secondary | ICD-10-CM

## 2020-10-24 ENCOUNTER — Other Ambulatory Visit: Payer: Self-pay

## 2020-10-24 ENCOUNTER — Ambulatory Visit (HOSPITAL_COMMUNITY): Admission: RE | Admit: 2020-10-24 | Payer: Commercial Managed Care - PPO | Source: Ambulatory Visit

## 2020-10-24 ENCOUNTER — Encounter (HOSPITAL_COMMUNITY): Payer: Self-pay

## 2021-03-09 ENCOUNTER — Other Ambulatory Visit: Payer: Self-pay | Admitting: Obstetrics and Gynecology

## 2021-03-09 DIAGNOSIS — Z1231 Encounter for screening mammogram for malignant neoplasm of breast: Secondary | ICD-10-CM

## 2021-03-24 ENCOUNTER — Other Ambulatory Visit: Payer: Self-pay | Admitting: Obstetrics and Gynecology

## 2021-03-27 NOTE — H&P (Signed)
Debbie Rowe is a 46 y.o. female here for IUD removal ?. ?Pt here for IUD retrieval . U/s show IUD in fundal position ?  ?  ?Past Medical History:  has a past medical history of Anxiety and Depression.  ?Past Surgical History:  has a past surgical history that includes Dilation and curettage of uterus and Cesarean section. ?Family History: family history includes Breast cancer in her maternal aunt, paternal aunt, and paternal grandmother. ?Social History:  reports that she has quit smoking. She has never used smokeless tobacco. She reports that she does not drink alcohol. ?OB/GYN History:  ?        ?OB History   ?  Gravida  ?2  ? Para  ?1  ? Term  ?1  ? Preterm  ?   ? AB  ?1  ? Living  ?1  ?  ?  SAB  ?1  ? IAB  ?   ? Ectopic  ?   ? Molar  ?   ? Multiple  ?   ? Live Births  ?1  ?  ?  ?   ?  ?  ?Allergies: has No Known Allergies. ?Medications: ?  ?Current Outpatient Medications:  ?  ALPRAZolam (XANAX) 0.5 MG tablet, Take 0.5 mg by mouth 3 (three) times daily as needed for Sleep., Disp: , Rfl:  ?  citalopram (CELEXA) 20 MG tablet, Take 5 mg by mouth once daily, Disp: , Rfl:  ?  copper intrauterine contraceptive (PARAGARD T) IUD, Insert 1 each into the uterus once Placed 06/2014  , Disp: , Rfl:  ?  doxylamine succinate (UNISOM) 25 mg tablet, Take 25 mg by mouth nightly as needed for Sleep, Disp: , Rfl:  ?  tranexamic acid (LYSTEDA) 650 mg tablet, Take 2 tablets (1,300 mg total) by mouth 3 (three) times daily Take for a maximum of 5 days during monthly menstruation. (Patient not taking: Reported on 03/09/2021), Disp: 30 tablet, Rfl: 3 ?  ?Review of Systems: ?General:                      No fatigue or weight loss ?Eyes:                           No vision changes ?Ears:                            No hearing difficulty ?Respiratory:                No cough or shortness of breath ?Pulmonary:                  No asthma or shortness of breath ?Cardiovascular:           No chest pain, palpitations, dyspnea on  exertion ?Gastrointestinal:          No abdominal bloating, chronic diarrhea, constipations, masses, pain or hematochezia ?Genitourinary:             No hematuria, dysuria, abnormal vaginal discharge, pelvic pain, Menometrorrhagia ?Lymphatic:                   No swollen lymph nodes ?Musculoskeletal:         No muscle weakness ?Neurologic:                  No extremity weakness, syncope, seizure disorder ?Psychiatric:  No history of depression, delusions or suicidal/homicidal ideation ?  ?  ? Exam:  ?  ?There were no vitals filed for this visit. ?  ?Body mass index is 29.89 kg/m?. ?  ?WDWN white/  female in NAD   ?Lungs: CTA  ?CV : RRR without murmur   ?  ?Pelvic: tanner stage 5 ,  ?External genitalia: vulva /labia no lesions ?Urethra: no prolapse ?Vagina: normal physiologic d/c ?Cervix: stenotic no lesions, no cervical motion tenderness  , cervix dilated with multiple dilators  And several attempts made with IUD retriever , randall stone forceps under ultrasound guidance . Pt tolerated poorly and I aborted the efforts . Throughout the procedure I asked  For verbal approval to proceed .  ?Uterus: normal size shape and contour, non-tender ?Adnexa: no mass,  non-tender   ?Rectovaginal:  ?Impression:  ?  ?The encounter diagnosis was Intrauterine contraceptive device threads lost, initial encounter. ?  ?  ?  ?Plan:  ?  ?Attempted to retrieve the IUD unsuccessful secondary to stenotic cervix and intolerance by the pt . I will schedule in the main OR for removal  ?  ?  ?  ?  ?No follow-ups on file. ?  ?Vilma Prader, MD ?  ?  ?  ?   ?  ?Electronically signed by Vilma Prader, MD on 03/23/2021  4:50 PM  ? ?

## 2021-03-29 ENCOUNTER — Encounter
Admission: RE | Admit: 2021-03-29 | Discharge: 2021-03-29 | Disposition: A | Discharge status: Home / Self Care | Payer: Commercial Managed Care - PPO | Source: Ambulatory Visit | Attending: Obstetrics and Gynecology | Admitting: Obstetrics and Gynecology

## 2021-03-29 ENCOUNTER — Other Ambulatory Visit: Payer: Self-pay

## 2021-03-29 NOTE — Patient Instructions (Addendum)
Your procedure is scheduled on: Friday, March 24 ?Report to the Registration Desk on the 1st floor of the [facility]. ?To find out your arrival time, please call [phone] between 1PM - 3PM on: Thursday, March 23 ? ?REMEMBER: ?Instructions that are not followed completely may result in serious medical risk, up to and including death; or upon the discretion of your surgeon and anesthesiologist your surgery may need to be rescheduled. ? ?Do not eat or drink after midnight the night before surgery.  ?No gum chewing, lozengers or hard candies. ? ?DO NOT TAKE ANY MEDICATIONS THE MORNING OF SURGERY ? ?One week prior to surgery: ?Stop Anti-inflammatories (NSAIDS) such as Advil, Aleve, Ibuprofen, Motrin, Naproxen, Naprosyn and Aspirin based products such as Excedrin, Goodys Powder, BC Powder. ?Stop ANY OVER THE COUNTER supplements until after surgery. ?You may however, continue to take Tylenol if needed for pain up until the day of surgery. ? ?No Alcohol for 24 hours before or after surgery. ? ?No Smoking including e-cigarettes for 24 hours prior to surgery.  ?No chewable tobacco products for at least 6 hours prior to surgery.  ?No nicotine patches on the day of surgery. ? ?Do not use any "recreational" drugs for at least a week prior to your surgery.  ?Please be advised that the combination of cocaine and anesthesia may have negative outcomes, up to and including death. ?If you test positive for cocaine, your surgery will be cancelled. ? ?On the morning of surgery brush your teeth with toothpaste and water, you may rinse your mouth with mouthwash if you wish. ?Do not swallow any toothpaste or mouthwash. ? ?Do not wear jewelry, make-up, hairpins, clips or nail polish. ? ?Do not wear lotions, powders, or perfumes.  ? ?Do not shave body from the neck down 48 hours prior to surgery just in case you cut yourself which could leave a site for infection.  ? ?Contact lenses, hearing aids and dentures may not be worn into  surgery. ? ?Do not bring valuables to the hospital. [facility] is not responsible for any missing/lost belongings or valuables.  ? ?Notify your doctor if there is any change in your medical condition (cold, fever, infection). ? ?Wear comfortable clothing (specific to your surgery type) to the hospital. ? ?After surgery, you can help prevent lung complications by doing breathing exercises.  ?Take deep breaths and cough every 1-2 hours. Your doctor may order a device called an Incentive Spirometer to help you take deep breaths. ? ?If you are being discharged the day of surgery, you will not be allowed to drive home. ?You will need a responsible adult (18 years or older) to drive you home and stay with you that night.  ? ?If you are taking public transportation, you will need to have a responsible adult (18 years or older) with you. ?Please confirm with your physician that it is acceptable to use public transportation.  ? ?Please call the Pre-admissions Testing Dept. at [phone] if you have any questions about these instructions. ? ?Surgery Visitation Policy: ? ?Patients undergoing a surgery or procedure may have two family members or support persons with them as long as the person is not COVID-19 positive or experiencing its symptoms.  ?

## 2021-03-31 ENCOUNTER — Ambulatory Visit: Payer: Commercial Managed Care - PPO | Admitting: Certified Registered"

## 2021-03-31 ENCOUNTER — Other Ambulatory Visit: Payer: Self-pay

## 2021-03-31 ENCOUNTER — Encounter
Admission: RE | Disposition: A | Discharge status: Home / Self Care | Payer: Self-pay | Source: Home / Self Care | Attending: Obstetrics and Gynecology

## 2021-03-31 ENCOUNTER — Encounter: Payer: Self-pay | Admitting: Obstetrics and Gynecology

## 2021-03-31 ENCOUNTER — Ambulatory Visit
Admission: RE | Admit: 2021-03-31 | Discharge: 2021-03-31 | Disposition: A | Discharge status: Home / Self Care | Payer: Commercial Managed Care - PPO | Attending: Obstetrics and Gynecology | Admitting: Obstetrics and Gynecology

## 2021-03-31 DIAGNOSIS — Z803 Family history of malignant neoplasm of breast: Secondary | ICD-10-CM | POA: Diagnosis not present

## 2021-03-31 DIAGNOSIS — Z01818 Encounter for other preprocedural examination: Secondary | ICD-10-CM

## 2021-03-31 DIAGNOSIS — M4802 Spinal stenosis, cervical region: Secondary | ICD-10-CM | POA: Insufficient documentation

## 2021-03-31 DIAGNOSIS — F32A Depression, unspecified: Secondary | ICD-10-CM | POA: Insufficient documentation

## 2021-03-31 DIAGNOSIS — Z87891 Personal history of nicotine dependence: Secondary | ICD-10-CM | POA: Insufficient documentation

## 2021-03-31 DIAGNOSIS — F419 Anxiety disorder, unspecified: Secondary | ICD-10-CM | POA: Diagnosis not present

## 2021-03-31 DIAGNOSIS — Z30432 Encounter for removal of intrauterine contraceptive device: Secondary | ICD-10-CM | POA: Insufficient documentation

## 2021-03-31 HISTORY — PX: IUD REMOVAL: SHX5392

## 2021-03-31 LAB — CBC
HCT: 37.7 % (ref 36.0–46.0)
Hemoglobin: 12.6 g/dL (ref 12.0–15.0)
MCH: 30.3 pg (ref 26.0–34.0)
MCHC: 33.4 g/dL (ref 30.0–36.0)
MCV: 90.6 fL (ref 80.0–100.0)
Platelets: 331 10*3/uL (ref 150–400)
RBC: 4.16 MIL/uL (ref 3.87–5.11)
RDW: 12.4 % (ref 11.5–15.5)
WBC: 4.7 10*3/uL (ref 4.0–10.5)
nRBC: 0 % (ref 0.0–0.2)

## 2021-03-31 LAB — TYPE AND SCREEN
ABO/RH(D): O NEG
Antibody Screen: NEGATIVE

## 2021-03-31 LAB — BASIC METABOLIC PANEL
Anion gap: 6 (ref 5–15)
BUN: 16 mg/dL (ref 6–20)
CO2: 27 mmol/L (ref 22–32)
Calcium: 8.4 mg/dL — ABNORMAL LOW (ref 8.9–10.3)
Chloride: 105 mmol/L (ref 98–111)
Creatinine, Ser: 0.55 mg/dL (ref 0.44–1.00)
GFR, Estimated: >60 mL/min (ref >60)
Glucose, Bld: 100 mg/dL — ABNORMAL HIGH (ref 70–99)
Potassium: 3.9 mmol/L (ref 3.5–5.1)
Sodium: 138 mmol/L (ref 135–145)

## 2021-03-31 LAB — ABO/RH: ABO/RH(D): O NEG

## 2021-03-31 LAB — POCT PREGNANCY, URINE: Preg Test, Ur: NEGATIVE

## 2021-03-31 SURGERY — REMOVAL, INTRAUTERINE DEVICE
Anesthesia: General

## 2021-03-31 MED ORDER — MIDAZOLAM HCL 2 MG/2ML IJ SOLN
INTRAMUSCULAR | Status: AC
  Filled 2021-03-31: qty 2

## 2021-03-31 MED ORDER — FAMOTIDINE 20 MG PO TABS
ORAL_TABLET | ORAL | Status: AC
  Administered 2021-03-31: 20 mg via ORAL
  Filled 2021-03-31: qty 1

## 2021-03-31 MED ORDER — DEXAMETHASONE SODIUM PHOSPHATE 10 MG/ML IJ SOLN
INTRAMUSCULAR | Status: DC | PRN
  Administered 2021-03-31: 10 mg via INTRAVENOUS

## 2021-03-31 MED ORDER — ONDANSETRON HCL 4 MG/2ML IJ SOLN
INTRAMUSCULAR | Status: DC | PRN
  Administered 2021-03-31: 4 mg via INTRAVENOUS

## 2021-03-31 MED ORDER — PROPOFOL 500 MG/50ML IV EMUL
INTRAVENOUS | Status: AC
  Filled 2021-03-31: qty 50

## 2021-03-31 MED ORDER — ACETAMINOPHEN 500 MG PO TABS
ORAL_TABLET | ORAL | Status: AC
  Administered 2021-03-31: 1000 mg via ORAL
  Filled 2021-03-31: qty 2

## 2021-03-31 MED ORDER — LACTATED RINGERS IV SOLN: INTRAVENOUS | Status: DC

## 2021-03-31 MED ORDER — CEFAZOLIN SODIUM-DEXTROSE 2-4 GM/100ML-% IV SOLN
2.0000 g | Freq: Once | INTRAVENOUS | Status: AC
  Administered 2021-03-31: 2 g via INTRAVENOUS

## 2021-03-31 MED ORDER — KETOROLAC TROMETHAMINE 30 MG/ML IJ SOLN
INTRAMUSCULAR | Status: DC | PRN
  Administered 2021-03-31: 30 mg via INTRAVENOUS

## 2021-03-31 MED ORDER — FENTANYL CITRATE (PF) 100 MCG/2ML IJ SOLN
INTRAMUSCULAR | Status: DC | PRN
  Administered 2021-03-31: 50 ug via INTRAVENOUS

## 2021-03-31 MED ORDER — FENTANYL CITRATE (PF) 100 MCG/2ML IJ SOLN: 25.0000 ug | INTRAMUSCULAR | Status: DC | PRN

## 2021-03-31 MED ORDER — MIDAZOLAM HCL 2 MG/2ML IJ SOLN
INTRAMUSCULAR | Status: DC | PRN
  Administered 2021-03-31: 2 mg via INTRAVENOUS

## 2021-03-31 MED ORDER — LACTATED RINGERS IV SOLN: INTRAVENOUS | Status: DC | PRN

## 2021-03-31 MED ORDER — GABAPENTIN 300 MG PO CAPS
ORAL_CAPSULE | ORAL | Status: AC
  Administered 2021-03-31: 300 mg via ORAL
  Filled 2021-03-31: qty 1

## 2021-03-31 MED ORDER — FERRIC SUBSULFATE 259 MG/GM EX SOLN
CUTANEOUS | Status: AC
  Filled 2021-03-31: qty 8

## 2021-03-31 MED ORDER — FENTANYL CITRATE (PF) 100 MCG/2ML IJ SOLN
INTRAMUSCULAR | Status: AC
  Filled 2021-03-31: qty 2

## 2021-03-31 MED ORDER — PROPOFOL 10 MG/ML IV BOLUS
INTRAVENOUS | Status: DC | PRN
  Administered 2021-03-31: 250 mg via INTRAVENOUS

## 2021-03-31 MED ORDER — FAMOTIDINE 20 MG PO TABS: 20.0000 mg | ORAL_TABLET | Freq: Once | ORAL | Status: AC

## 2021-03-31 MED ORDER — CHLORHEXIDINE GLUCONATE 0.12 % MT SOLN: 15.0000 mL | Freq: Once | OROMUCOSAL | Status: AC

## 2021-03-31 MED ORDER — ORAL CARE MOUTH RINSE: 15.0000 mL | Freq: Once | OROMUCOSAL | Status: AC

## 2021-03-31 MED ORDER — SILVER NITRATE-POT NITRATE 75-25 % EX MISC
CUTANEOUS | Status: DC | PRN
  Administered 2021-03-31: 3

## 2021-03-31 MED ORDER — LIDOCAINE HCL (CARDIAC) PF 100 MG/5ML IV SOSY
PREFILLED_SYRINGE | INTRAVENOUS | Status: DC | PRN
  Administered 2021-03-31: 100 mg via INTRAVENOUS

## 2021-03-31 MED ORDER — ACETAMINOPHEN 500 MG PO TABS: 1000.0000 mg | ORAL_TABLET | ORAL | Status: AC

## 2021-03-31 MED ORDER — OXYCODONE HCL 5 MG PO TABS: 5.0000 mg | ORAL_TABLET | Freq: Once | ORAL | Status: DC | PRN

## 2021-03-31 MED ORDER — GABAPENTIN 300 MG PO CAPS: 300.0000 mg | ORAL_CAPSULE | ORAL | Status: AC

## 2021-03-31 MED ORDER — OXYCODONE HCL 5 MG/5ML PO SOLN: 5.0000 mg | Freq: Once | ORAL | Status: DC | PRN

## 2021-03-31 MED ORDER — POVIDONE-IODINE 10 % EX SWAB
2.0000 "application " | Freq: Once | CUTANEOUS | Status: AC
  Administered 2021-03-31: 2 via TOPICAL

## 2021-03-31 MED ORDER — CEFAZOLIN SODIUM-DEXTROSE 2-4 GM/100ML-% IV SOLN
INTRAVENOUS | Status: AC
  Filled 2021-03-31: qty 100

## 2021-03-31 MED ORDER — CHLORHEXIDINE GLUCONATE 0.12 % MT SOLN
OROMUCOSAL | Status: AC
  Administered 2021-03-31: 15 mL via OROMUCOSAL
  Filled 2021-03-31: qty 15

## 2021-03-31 MED ORDER — ONDANSETRON HCL 4 MG/2ML IJ SOLN: 4.0000 mg | Freq: Once | INTRAMUSCULAR | Status: DC | PRN

## 2021-03-31 SURGICAL SUPPLY — 27 items
APPLICATOR SWAB PROCTO LG 16IN (MISCELLANEOUS) ×2 IMPLANT
BACTOSHIELD CHG 4% 4OZ (MISCELLANEOUS) ×1
DRSG TELFA 3X8 NADH (GAUZE/BANDAGES/DRESSINGS) IMPLANT
ELECT REM PT RETURN 9FT ADLT (ELECTROSURGICAL) ×2
ELECTRODE REM PT RTRN 9FT ADLT (ELECTROSURGICAL) ×1 IMPLANT
GLOVE SURG SYN 8.0 (GLOVE) ×2 IMPLANT
GLOVE SURG SYN 8.0 PF PI (GLOVE) ×1 IMPLANT
GOWN STRL REUS W/ TWL LRG LVL3 (GOWN DISPOSABLE) ×1 IMPLANT
GOWN STRL REUS W/ TWL XL LVL3 (GOWN DISPOSABLE) ×1 IMPLANT
GOWN STRL REUS W/TWL LRG LVL3 (GOWN DISPOSABLE) ×2
GOWN STRL REUS W/TWL XL LVL3 (GOWN DISPOSABLE) ×2
IV LACTATED RINGER IRRG 3000ML (IV SOLUTION) ×2
IV LR IRRIG 3000ML ARTHROMATIC (IV SOLUTION) ×1 IMPLANT
KIT PROCEDURE FLUENT (KITS) IMPLANT
KIT TURNOVER CYSTO (KITS) ×2 IMPLANT
MANIFOLD NEPTUNE II (INSTRUMENTS) ×2 IMPLANT
PACK DNC HYST (MISCELLANEOUS) ×2 IMPLANT
PAD DRESSING TELFA 3X8 NADH (GAUZE/BANDAGES/DRESSINGS) IMPLANT
PAD OB MATERNITY 4.3X12.25 (PERSONAL CARE ITEMS) ×2 IMPLANT
PAD PREP 24X41 OB/GYN DISP (PERSONAL CARE ITEMS) ×2 IMPLANT
SCRUB CHG 4% DYNA-HEX 4OZ (MISCELLANEOUS) ×1 IMPLANT
SET CYSTO W/LG BORE CLAMP LF (SET/KITS/TRAYS/PACK) IMPLANT
SUT VIC AB 2-0 SH 27 (SUTURE) ×2
SUT VIC AB 2-0 SH 27XBRD (SUTURE) IMPLANT
TOWEL OR 17X26 4PK STRL BLUE (TOWEL DISPOSABLE) ×2 IMPLANT
TUBING CONNECTING 10 (TUBING) ×2 IMPLANT
WATER STERILE IRR 500ML POUR (IV SOLUTION) ×2 IMPLANT

## 2021-03-31 NOTE — Progress Notes (Signed)
Pt here for retrieval of lost IUD stings , inability to retrieve in office .  ?LAbs reviewed . All questions answered . Proceed  ?

## 2021-03-31 NOTE — Anesthesia Procedure Notes (Signed)
Procedure Name: LMA Insertion ?Date/Time: 03/31/2021 9:39 AM ?Performed by: Philbert Riser, CRNA ?Pre-anesthesia Checklist: Patient identified, Patient being monitored, Timeout performed, Emergency Drugs available and Suction available ?Patient Re-evaluated:Patient Re-evaluated prior to induction ?Oxygen Delivery Method: Circle system utilized ?Preoxygenation: Pre-oxygenation with 100% oxygen ?Induction Type: IV induction ?Ventilation: Mask ventilation without difficulty ?LMA: LMA inserted ?LMA Size: 3.0 ?Grade View: Grade I ?Tube type: Oral ?Tube size: 7.0 mm ?Number of attempts: 1 ?Airway Equipment and Method: Stylet ?Placement Confirmation: positive ETCO2 and breath sounds checked- equal and bilateral ?Secured at: 21 cm ?Tube secured with: Tape ?Dental Injury: Teeth and Oropharynx as per pre-operative assessment  ? ? ? ? ?

## 2021-03-31 NOTE — Op Note (Signed)
NAME: Debbie Rowe, Debbie Rowe. ?MEDICAL RECORD NO: [id number] ?ACCOUNT NO: [id number] ?DATE OF BIRTH: 04-20-1975 ?FACILITY: [facility] ?LOCATION: [facility] ?PHYSICIAN: Suzy Bouchard, MD ? ?Operative Report  ? ?DATE OF PROCEDURE: 03/31/2021 ? ?PREOPERATIVE DIAGNOSIS:  Cervical stenosis, inability to retrieve lost IUD. ? ?POSTOPERATIVE DIAGNOSIS:  Cervical stenosis, inability to retrieve lost IUD. ? ?PROCEDURE:  Cervical dilation and IUD retrieval. ? ?SURGEON:  Suzy Bouchard, MD ? ?ANESTHESIA:  LMA. ? ?INDICATIONS:  A 46 year old female with a ParaGard IUD, wishes to have removal.  Attempted to retrieve an IUD with lost strings in the office and due to cervical stenosis and patient's intolerance to the procedure, it was opted to have the IUD removed in ? the operating room. ? ?DESCRIPTION OF PROCEDURE:  After adequate LMA anesthesia, the patient was placed in dorsal supine position with the legs in the candy cane stirrups.  The patient's lower abdomen, perineum and vagina were prepped and draped in normal sterile fashion.   ?Timeout was performed.  Straight catheterization of the bladder yielded 150 mL clear urine.  The patient did receive 2 grams of IV Ancef for surgical prophylaxis.  Weighted speculum was placed in the posterior vaginal vault and the anterior cervix was  ?grasped with a single tooth tenaculum.  Cervix was dilated to #16 Hanks dilator without difficulty.  Significant cervical stenosis was noted at the procedure.  Randall stone forceps were then placed into the endometrial cavity and the ParaGard IUD was  ?removed intact.  There were no complications.  The patient tolerated the procedure well. ? ?INTRAOPERATIVE FLUIDS:  400 mL. ? ?ESTIMATED BLOOD LOSS:  Minimal. ? ?URINE OUTPUT:  150 mL. ? ?DISPOSITION: The patient was taken to recovery room in good condition. ? ? ?[facility]UK ?D: 03/31/2021 10:43:38 am T: 03/31/2021 11:21:00 am  ?JOB: [id number]/ [id number]  ?

## 2021-03-31 NOTE — Transfer of Care (Signed)
Immediate Anesthesia Transfer of Care Note ? ?Patient: Debbie Rowe ? ?Procedure(s) Performed: POSSIBLE HYSTEROSCOPY ?INTRAUTERINE DEVICE (IUD) REMOVAL ? ?Patient Location: PACU ? ?Anesthesia Type:General ? ?Level of Consciousness: drowsy ? ?Airway & Oxygen Therapy: Patient Spontanous Breathing and Patient connected to face mask oxygen ? ?Post-op Assessment: Report given to RN and Post -op Vital signs reviewed and stable ? ?Post vital signs: Reviewed and stable ? ?Last Vitals:  ?Vitals Value Taken Time  ?BP 123/53 03/31/21 1019  ?Temp    ?Pulse 62 03/31/21 1025  ?Resp 17 03/31/21 1026  ?SpO2 100 % 03/31/21 1025  ?Vitals shown include unvalidated device data. ? ?Last Pain:  ?Vitals:  ? 03/31/21 0855  ?TempSrc: Oral  ?PainSc: 0-No pain  ?   ? ?Patients Stated Pain Goal: 0 (03/31/21 0855) ? ?Complications: No notable events documented. ?

## 2021-03-31 NOTE — Anesthesia Postprocedure Evaluation (Signed)
Anesthesia Post Note ? ?Patient: VERSIE SOAVE ? ?Procedure(s) Performed: INTRAUTERINE DEVICE (IUD) REMOVAL ? ?Patient location during evaluation: PACU ?Anesthesia Type: General ?Level of consciousness: awake and alert ?Pain management: pain level controlled ?Vital Signs Assessment: post-procedure vital signs reviewed and stable ?Respiratory status: spontaneous breathing, nonlabored ventilation, respiratory function stable and patient connected to nasal cannula oxygen ?Cardiovascular status: blood pressure returned to baseline and stable ?Postop Assessment: no apparent nausea or vomiting ?Anesthetic complications: no ? ? ?No notable events documented. ? ? ?Last Vitals:  ?Vitals:  ? 03/31/21 1045 03/31/21 1103  ?BP: 116/76   ?Pulse: 65 72  ?Resp: 19 16  ?Temp:  36.9 ?C  ?SpO2: 97% 100%  ?  ?Last Pain:  ?Vitals:  ? 03/31/21 1103  ?TempSrc: Temporal  ?PainSc: 0-No pain  ? ? ?  ?  ?  ?  ?  ?  ? ?Corinda Gubler ? ? ? ? ?

## 2021-03-31 NOTE — Brief Op Note (Signed)
03/31/2021 ? ?10:09 AM ? ?PATIENT:  QUYNH RUZICKA  46 y.o. female ? ?PRE-OPERATIVE DIAGNOSIS:  Lost IUD ? ?POST-OPERATIVE DIAGNOSIS: same as above  ? ?PROCEDURE:  Dilation cervical and IUD retrieval ?SURGEON:  Surgeon(s) and Role: ?   * Deronda Christian, Gwen Her, MD - Primary ? ?PHYSICIAN ASSISTANT:  ? ?ASSISTANTS: none  ? ?ANESTHESIA:    lma ? ?EBL:  1 mL IOF 400 cc , ou 150 cc ? ?BLOOD ADMINISTERED:none ? ?DRAINS: none  ? ?LOCAL MEDICATIONS USED:  NONE ? ?SPECIMEN:  No Specimen ? ?DISPOSITION OF SPECIMEN:  N/A ? ?COUNTS:  YES ? ?TOURNIQUET:  * No tourniquets in log * ? ?DICTATION: .Other Dictation: Dictation Number verbal ? ?PLAN OF CARE: Discharge to home after PACU ? ?PATIENT DISPOSITION:  PACU - hemodynamically stable. ?  ?Delay start of Pharmacological VTE agent (>24hrs) due to surgical blood loss or risk of bleeding: not applicable ? ?

## 2021-03-31 NOTE — Discharge Instructions (Signed)

## 2021-03-31 NOTE — Anesthesia Preprocedure Evaluation (Signed)
Anesthesia Evaluation  ?Patient identified by MRN, date of birth, ID band ?Patient awake ? ? ? ?Reviewed: ?Allergy & Precautions, NPO status , Patient's Chart, lab work & pertinent test results ? ?History of Anesthesia Complications ?Negative for: history of anesthetic complications ? ?Airway ?Mallampati: II ? ?TM Distance: >3 FB ?Neck ROM: Full ? ? ? Dental ?no notable dental hx. ?(+) Teeth Intact ?  ?Pulmonary ?neg pulmonary ROS, neg sleep apnea, neg COPD, Patient abstained from smoking.Not current smoker, former smoker,  ?  ?Pulmonary exam normal ?breath sounds clear to auscultation ? ? ? ? ? ? Cardiovascular ?Exercise Tolerance: Good ?METS(-) hypertension(-) CAD and (-) Past MI negative cardio ROS ? ?(-) dysrhythmias  ?Rhythm:Regular Rate:Normal ?- Systolic murmurs ? ?  ?Neuro/Psych ?PSYCHIATRIC DISORDERS Anxiety Takes daily xanaxnegative neurological ROS ?   ? GI/Hepatic ?neg GERD  ,(+)  ?  ? (-) substance abuse ? ,   ?Endo/Other  ?neg diabetes ? Renal/GU ?negative Renal ROS  ? ?  ?Musculoskeletal ? ? Abdominal ?  ?Peds ? Hematology ?  ?Anesthesia Other Findings ?Past Medical History: ?No date: Anxiety ? Reproductive/Obstetrics ? ?  ? ? ? ? ? ? ? ? ? ? ? ? ? ?  ?  ? ? ? ? ? ? ? ? ?Anesthesia Physical ?Anesthesia Plan ? ?ASA: 2 ? ?Anesthesia Plan: General  ? ?Post-op Pain Management: Tylenol PO (pre-op)*, Gabapentin PO (pre-op)* and Toradol IV (intra-op)*  ? ?Induction: Intravenous ? ?PONV Risk Score and Plan: 4 or greater and Ondansetron, Dexamethasone and Midazolam ? ?Airway Management Planned: LMA ? ?Additional Equipment: None ? ?Intra-op Plan:  ? ?Post-operative Plan: Extubation in OR ? ?Informed Consent: I have reviewed the patients History and Physical, chart, labs and discussed the procedure including the risks, benefits and alternatives for the proposed anesthesia with the patient or authorized representative who has indicated his/her understanding and acceptance.   ? ? ? ?Dental advisory given ? ?Plan Discussed with: CRNA and Surgeon ? ?Anesthesia Plan Comments: (Discussed risks of anesthesia with patient, including PONV, sore throat, lip/dental/eye damage. Rare risks discussed as well, such as cardiorespiratory and neurological sequelae, and allergic reactions. Discussed the role of CRNA in patient's perioperative care. Patient understands.)  ? ? ? ? ? ? ?Anesthesia Quick Evaluation ? ?

## 2021-04-03 ENCOUNTER — Encounter: Payer: Self-pay | Admitting: Obstetrics and Gynecology

## 2021-04-18 ENCOUNTER — Ambulatory Visit
Admission: RE | Admit: 2021-04-18 | Discharge: 2021-04-18 | Disposition: A | Discharge status: Home / Self Care | Payer: Commercial Managed Care - PPO | Source: Ambulatory Visit | Attending: Obstetrics and Gynecology | Admitting: Obstetrics and Gynecology

## 2021-04-18 DIAGNOSIS — Z1231 Encounter for screening mammogram for malignant neoplasm of breast: Secondary | ICD-10-CM | POA: Insufficient documentation

## 2021-09-18 ENCOUNTER — Emergency Department (HOSPITAL_COMMUNITY): Payer: Commercial Managed Care - PPO

## 2021-09-18 ENCOUNTER — Encounter (HOSPITAL_COMMUNITY): Payer: Self-pay | Admitting: *Deleted

## 2021-09-18 ENCOUNTER — Emergency Department (HOSPITAL_COMMUNITY)
Admission: EM | Admit: 2021-09-18 | Discharge: 2021-09-18 | Disposition: A | Discharge status: Home / Self Care | Payer: Commercial Managed Care - PPO | Attending: Emergency Medicine | Admitting: Emergency Medicine

## 2021-09-18 ENCOUNTER — Other Ambulatory Visit: Payer: Self-pay

## 2021-09-18 DIAGNOSIS — Z20822 Contact with and (suspected) exposure to covid-19: Secondary | ICD-10-CM | POA: Insufficient documentation

## 2021-09-18 DIAGNOSIS — R457 State of emotional shock and stress, unspecified: Secondary | ICD-10-CM | POA: Diagnosis not present

## 2021-09-18 DIAGNOSIS — R519 Headache, unspecified: Secondary | ICD-10-CM | POA: Insufficient documentation

## 2021-09-18 DIAGNOSIS — F4320 Adjustment disorder, unspecified: Secondary | ICD-10-CM

## 2021-09-18 HISTORY — DX: Depression, unspecified: F32.A

## 2021-09-18 LAB — CBC WITH DIFFERENTIAL/PLATELET
Abs Immature Granulocytes: 0.02 10*3/uL (ref 0.00–0.07)
Basophils Absolute: 0.1 10*3/uL (ref 0.0–0.1)
Basophils Relative: 1 %
Eosinophils Absolute: 0.2 10*3/uL (ref 0.0–0.5)
Eosinophils Relative: 2 %
HCT: 37.9 % (ref 36.0–46.0)
Hemoglobin: 13.2 g/dL (ref 12.0–15.0)
Immature Granulocytes: 0 %
Lymphocytes Relative: 31 %
Lymphs Abs: 2.3 10*3/uL (ref 0.7–4.0)
MCH: 30.7 pg (ref 26.0–34.0)
MCHC: 34.8 g/dL (ref 30.0–36.0)
MCV: 88.1 fL (ref 80.0–100.0)
Monocytes Absolute: 0.7 10*3/uL (ref 0.1–1.0)
Monocytes Relative: 10 %
Neutro Abs: 4.1 10*3/uL (ref 1.7–7.7)
Neutrophils Relative %: 56 %
Platelets: 355 10*3/uL (ref 150–400)
RBC: 4.3 MIL/uL (ref 3.87–5.11)
RDW: 12.4 % (ref 11.5–15.5)
WBC: 7.4 10*3/uL (ref 4.0–10.5)
nRBC: 0 % (ref 0.0–0.2)

## 2021-09-18 LAB — BASIC METABOLIC PANEL
Anion gap: 5 (ref 5–15)
BUN: 16 mg/dL (ref 6–20)
CO2: 27 mmol/L (ref 22–32)
Calcium: 8.9 mg/dL (ref 8.9–10.3)
Chloride: 108 mmol/L (ref 98–111)
Creatinine, Ser: 0.64 mg/dL (ref 0.44–1.00)
GFR, Estimated: >60 mL/min (ref >60)
Glucose, Bld: 91 mg/dL (ref 70–99)
Potassium: 3.8 mmol/L (ref 3.5–5.1)
Sodium: 140 mmol/L (ref 135–145)

## 2021-09-18 LAB — MAGNESIUM: Magnesium: 2.2 mg/dL (ref 1.7–2.4)

## 2021-09-18 LAB — TSH: TSH: 3.264 u[IU]/mL (ref 0.350–4.500)

## 2021-09-18 LAB — SARS CORONAVIRUS 2 BY RT PCR: SARS Coronavirus 2 by RT PCR: NEGATIVE

## 2021-09-18 MED ORDER — DIPHENHYDRAMINE HCL 50 MG/ML IJ SOLN
12.5000 mg | Freq: Once | INTRAMUSCULAR | Status: AC
  Administered 2021-09-18: 12.5 mg via INTRAVENOUS
  Filled 2021-09-18: qty 1

## 2021-09-18 MED ORDER — ALPRAZOLAM 0.5 MG PO TABS
0.5000 mg | ORAL_TABLET | Freq: Once | ORAL | Status: AC
Start: 2021-09-18 — End: 2021-09-18
  Administered 2021-09-18: 0.5 mg via ORAL
  Filled 2021-09-18: qty 1

## 2021-09-18 MED ORDER — HYDROXYZINE HCL 25 MG PO TABS: 25.0000 mg | ORAL_TABLET | Freq: Three times a day (TID) | ORAL | 0 refills | Status: AC | PRN

## 2021-09-18 MED ORDER — KETOROLAC TROMETHAMINE 15 MG/ML IJ SOLN
15.0000 mg | Freq: Once | INTRAMUSCULAR | Status: AC
  Administered 2021-09-18: 15 mg via INTRAVENOUS
  Filled 2021-09-18: qty 1

## 2021-09-18 MED ORDER — PROCHLORPERAZINE EDISYLATE 10 MG/2ML IJ SOLN
5.0000 mg | Freq: Once | INTRAMUSCULAR | Status: AC
  Administered 2021-09-18: 5 mg via INTRAVENOUS
  Filled 2021-09-18: qty 2

## 2021-09-18 MED ORDER — DIPHENHYDRAMINE HCL 25 MG PO TABS: 25.0000 mg | ORAL_TABLET | Freq: Every evening | ORAL | 0 refills | Status: AC | PRN

## 2021-09-18 MED ORDER — METOCLOPRAMIDE HCL 10 MG PO TABS: 10.0000 mg | ORAL_TABLET | Freq: Two times a day (BID) | ORAL | 0 refills | Status: AC | PRN

## 2021-09-18 MED ORDER — METOCLOPRAMIDE HCL 5 MG/ML IJ SOLN
5.0000 mg | Freq: Once | INTRAMUSCULAR | Status: AC
  Administered 2021-09-18: 5 mg via INTRAVENOUS
  Filled 2021-09-18: qty 2

## 2021-09-18 MED ORDER — SODIUM CHLORIDE 0.9 % IV BOLUS
1000.0000 mL | Freq: Once | INTRAVENOUS | Status: AC
  Administered 2021-09-18: 1000 mL via INTRAVENOUS

## 2021-09-18 NOTE — ED Triage Notes (Signed)
Pt states she has been on celexa 10mg  x 7 years and has been weaning herself off the medication for the last few months  Pt has been consulting with her PCP about the weaning off and he has been in agreement  Pt states last Monday she started having some increased anxiety and feeling of "doom and gloom"  Pt states this past Saturday she had a manic episode and she took one celexa  Pt states on Sunday she started feeling dizzy and tearful  Pt states she feels like her thoughts are all jumbled up  Pt states she feels like her brain is "pulsating" and feels like she is under water  Pt states she is having a nervous breakdown

## 2021-09-18 NOTE — ED Provider Notes (Signed)
[facility] EMERGENCY DEPARTMENT Provider Note   CSN: [id number] Arrival date & time: 09/18/21  1537     History {Add pertinent medical, surgical, social history, OB history to HPI:1} Chief Complaint  Patient presents with   Depression    Debbie Rowe is a 46 y.o. female presenting to the ED with concern for emotional lability and headache.  Patient reports she has been weaning herself off of her Celexa which she was on for 7 years, had been gradually wean herself for the past year and a half in conjunction with her PCP, and came off the medicine completely a month ago.  She reports that 3 days ago she began having what felt like an anxiety attack, reports headache that has been persistent for 3 days, also seeing "black squiggly lines" and shadows when she tries to read, and feeling like there is "a wave washing over me" in feeling like "my head is swimming or sinking in the water".  She says she has had emotional lability and has been crying a lot.  She feels that depression hit her like a wave  She denies any fevers, chills, cough, congestion.  She denies any other significant medical problems.  She does not take any other medications at baseline.  She has been trying ibuprofen for headache with little relief.  HPI     Home Medications Prior to Admission medications   Medication Sig Start Date End Date Taking? Authorizing Provider  ALPRAZolam Prudy Feeler) 0.5 MG tablet Take 0.5 mg by mouth at bedtime.   Yes [provider]  citalopram (CELEXA) 10 MG tablet Take 5 mg by mouth at bedtime.   Yes [provider]  doxylamine, Sleep, (UNISOM) 25 MG tablet Take 25 mg by mouth at bedtime as needed for sleep.   Yes [provider]  Melatonin 5 MG CHEW Chew by mouth.   Yes [provider]  Levonorgestrel-Ethinyl Estradiol (AMETHIA) 0.1-0.02 & 0.01 MG tablet Take 1 tablet by mouth daily. Patient not taking: Reported on 09/18/2021 04/21/21 04/21/22  [provider]      Allergies    Patient has no known allergies.    Review of Systems   Review of Systems  Physical Exam Updated Vital Signs BP (!) 141/77   Pulse 61   Temp 98 F (36.7 C) (Oral)   Resp 18   Ht 4\' 11"  (1.499 m)   Wt 66.7 kg   LMP 09/14/2021   SpO2 98%   BMI 29.69 kg/m  Physical Exam  ED Results / Procedures / Treatments   Labs (all labs ordered are listed, but only abnormal results are displayed) Labs Reviewed  SARS CORONAVIRUS 2 BY RT PCR  BASIC METABOLIC PANEL  CBC WITH DIFFERENTIAL/PLATELET  TSH  T4, FREE  MAGNESIUM    EKG None  Radiology No results found.  Procedures Procedures  {Document cardiac monitor, telemetry assessment procedure when appropriate:1}  Medications Ordered in ED Medications  metoCLOPramide (REGLAN) injection 5 mg (has no administration in time range)  ketorolac (TORADOL) 15 MG/ML injection 15 mg (has no administration in time range)  diphenhydrAMINE (BENADRYL) injection 12.5 mg (has no administration in time range)  prochlorperazine (COMPAZINE) injection 5 mg (has no administration in time range)  sodium chloride 0.9 % bolus 1,000 mL (has no administration in time range)    ED Course/ Medical Decision Making/ A&P  Medical Decision Making Amount and/or Complexity of Data Reviewed Labs: ordered. Radiology: ordered.  Risk Prescription drug management.   ***  {Document critical care time when appropriate:1} {Document review of labs and clinical decision tools ie heart score, Chads2Vasc2 etc:1}  {Document your independent review of radiology images, and any outside records:1} {Document your discussion with family members, caretakers, and with consultants:1} {Document social determinants of health affecting pt's care:1} {Document your decision making why or why not admission, treatments were needed:1} Final Clinical Impression(s) / ED Diagnoses Final diagnoses:  None    Rx / DC  Orders ED Discharge Orders     None

## 2021-09-18 NOTE — Discharge Instructions (Addendum)
For your nerves and anxiety prescribed you Atarax she can take up to 3 times as needed.  For your headache continue taking ibuprofen and Tylenol regularly at home.  You can also take Benadryl 25 mg with Reglan at night if you need help sleeping.  Please contact your primary care clinic to schedule follow-up appointment as soon as possible for both your anxiety issue, depression, as well as your headaches.  Please log on your patient portal tomorrow morning to follow-up on the results of your thyroid studies.  Your covid test resulted NEGATIVE tonight.  You can contact your doctor's office if either of these are abnormal

## 2021-09-19 LAB — T4, FREE: Free T4: 1.14 ng/dL — ABNORMAL HIGH (ref 0.61–1.12)

## 2022-06-26 ENCOUNTER — Inpatient Hospital Stay: Payer: Commercial Managed Care - PPO

## 2022-06-26 ENCOUNTER — Inpatient Hospital Stay: Payer: Commercial Managed Care - PPO | Attending: Oncology | Admitting: Licensed Clinical Social Worker

## 2023-07-27 IMAGING — MG MM DIGITAL SCREENING BILAT W/ TOMO AND CAD
8 series · 8 of 24 positions shown · non-contrast
Comparison: None.

CLINICAL DATA: Screening.

EXAM:
DIGITAL SCREENING BILATERAL MAMMOGRAM WITH TOMOSYNTHESIS AND CAD
TECHNIQUE: Bilateral screening digital craniocaudal and mediolateral oblique
mammograms were obtained. Bilateral screening digital breast
tomosynthesis was performed. The images were evaluated with
computer-aided detection.

[L CC synth-2D]
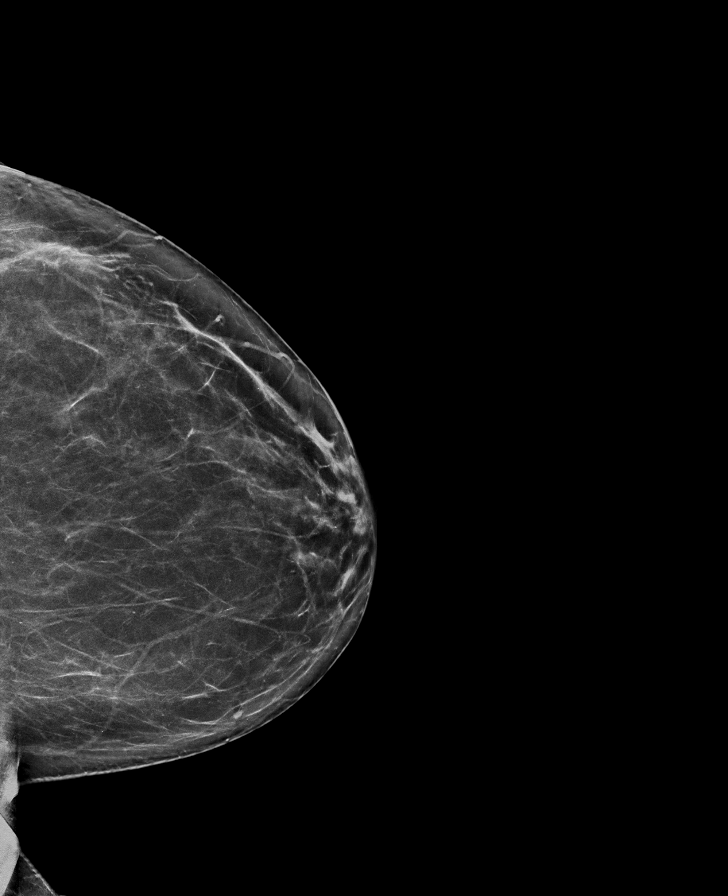

[R CC synth-2D]
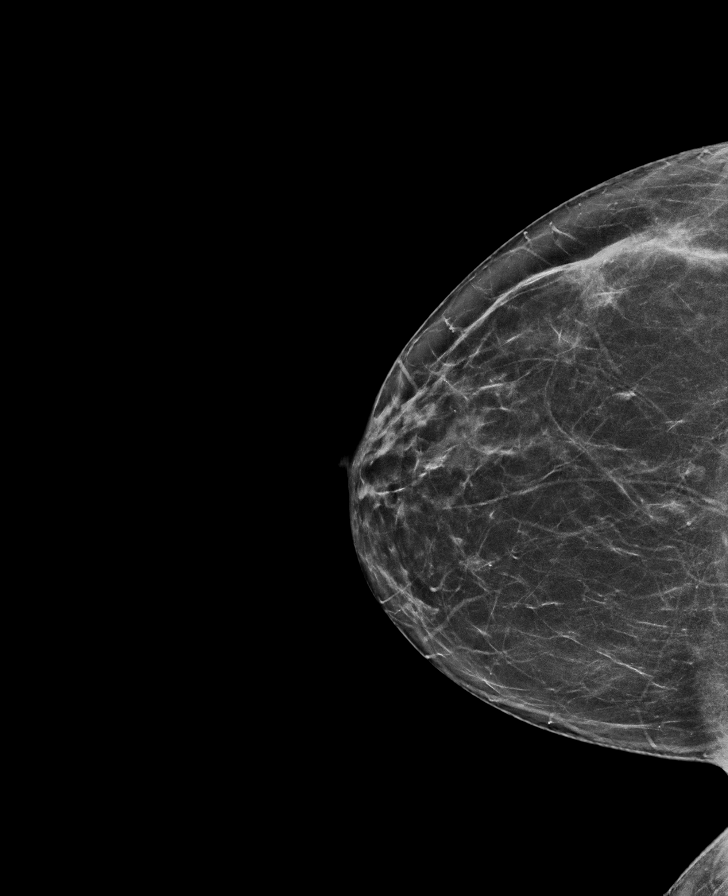

[R MLO synth-2D]
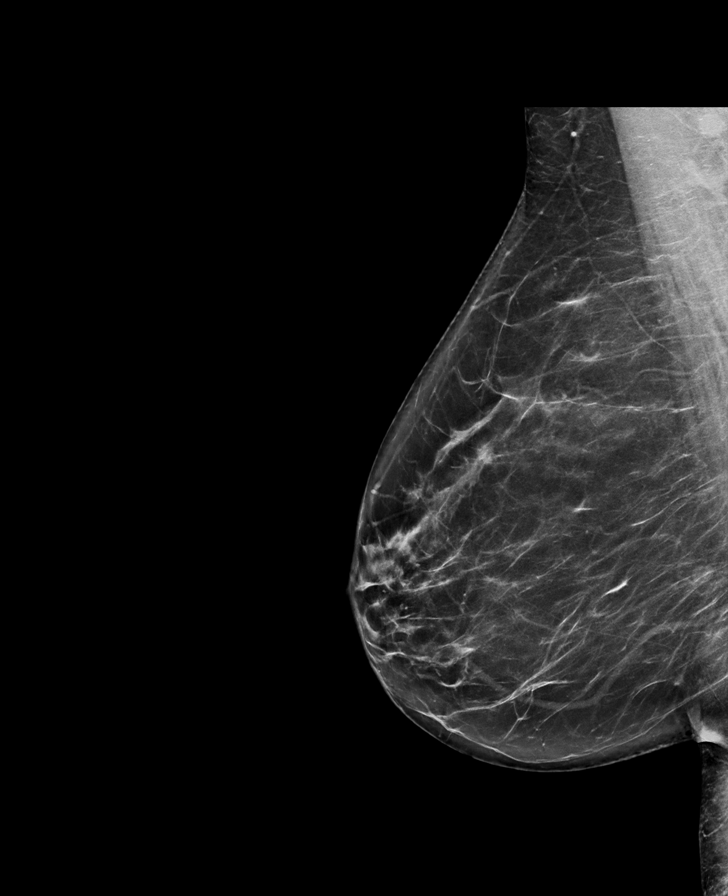

[L MLO synth-2D]
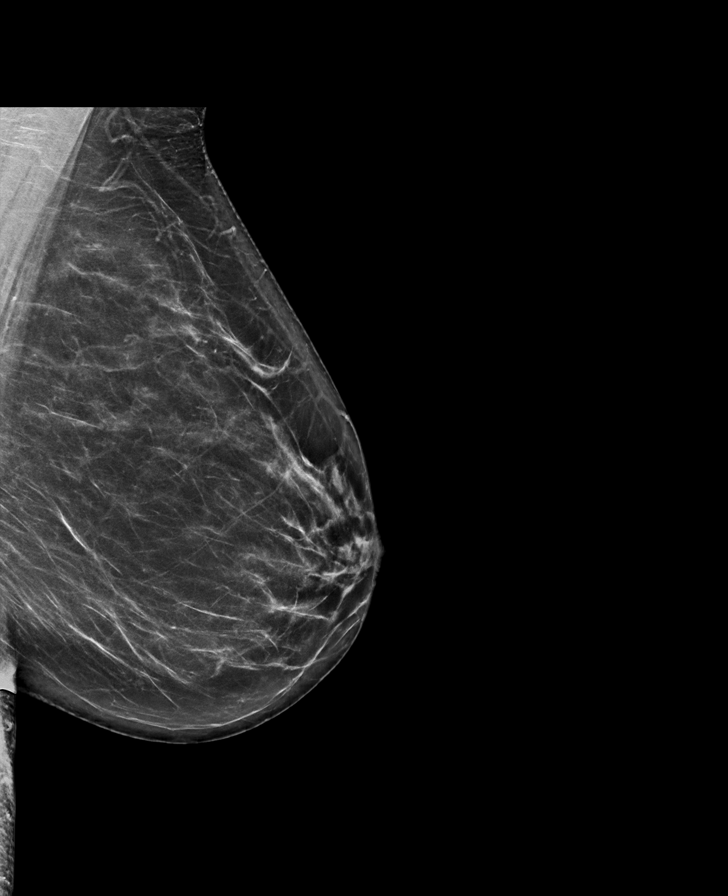

[L CC tomo · tomo slice 37/72.0]
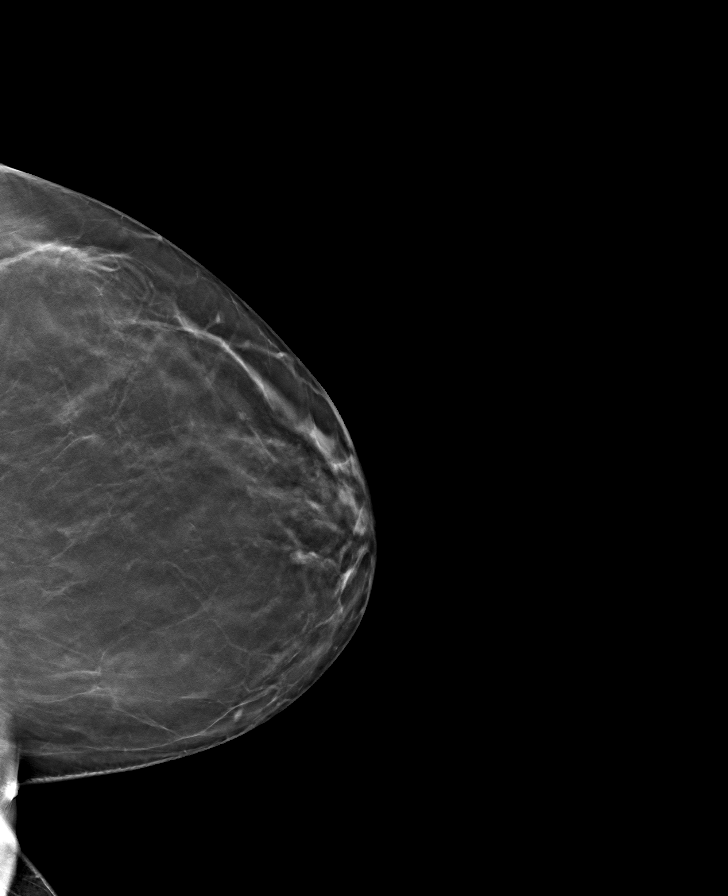

[L MLO tomo · tomo slice 39/78.0]
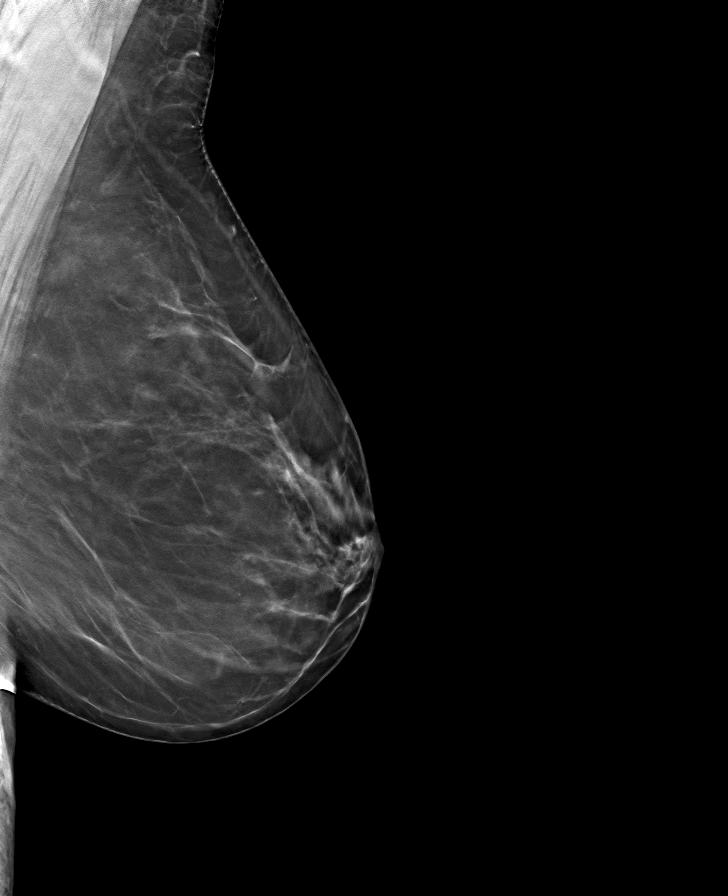

[R MLO tomo · tomo slice 41/80.0]
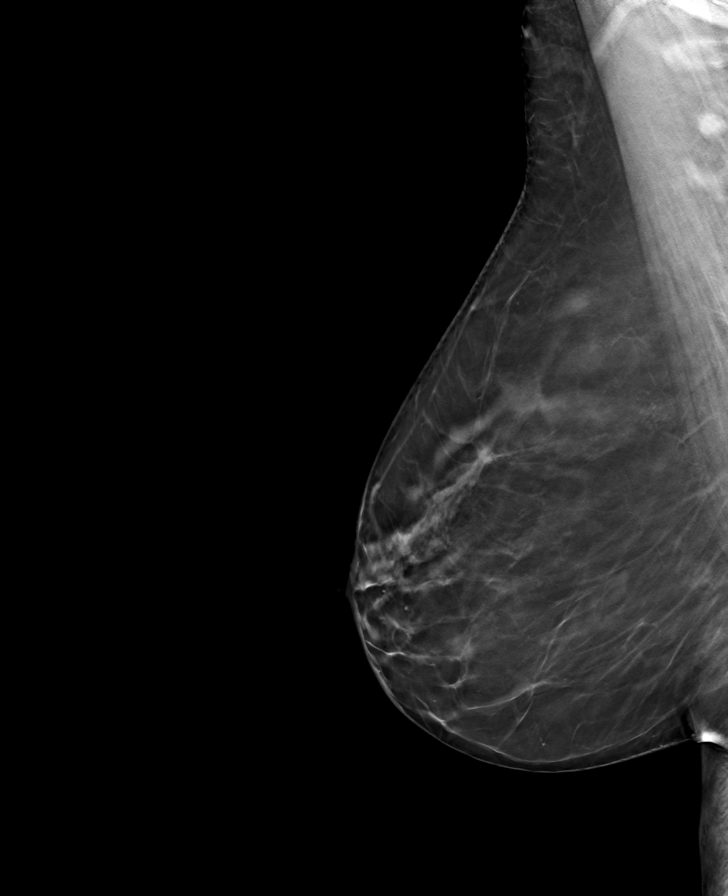

[R CC tomo · tomo slice 35/68.0]
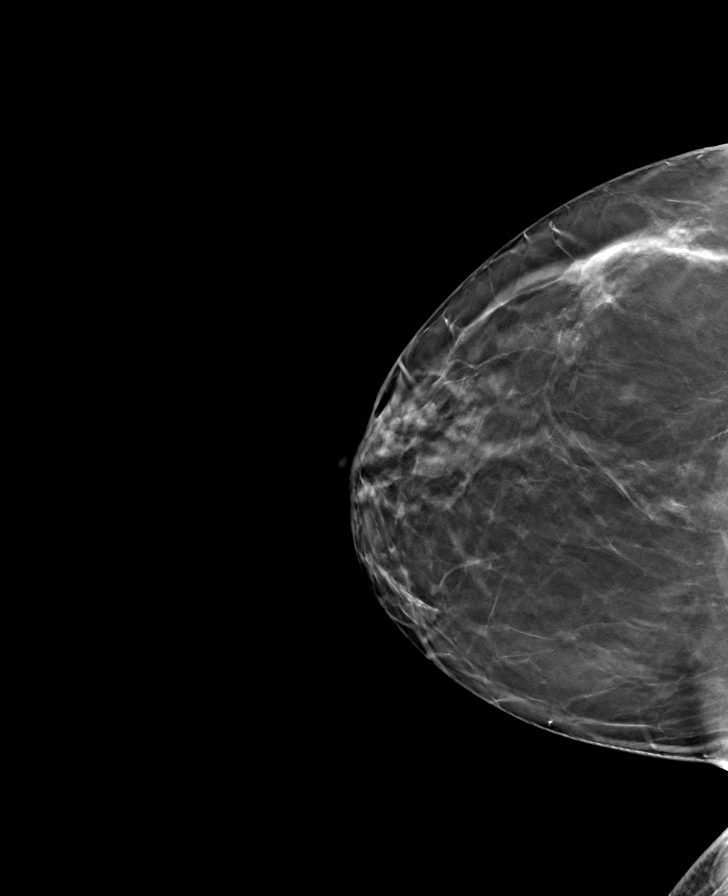

[8 of 24 positions shown; findings below may reference images not displayed]

ACR Breast Density Category b: There are scattered areas of
fibroglandular density.
FINDINGS: There are no findings suspicious for malignancy.
IMPRESSION: No mammographic evidence of malignancy. A result letter of this
screening mammogram will be mailed directly to the patient.

RECOMMENDATION:
Screening mammogram in one year. (Code:XG-X-X7B)

BI-RADS CATEGORY  1: Negative.
